# Patient Record
Sex: Male | Born: 1990 | Race: White | Hispanic: No | Marital: Married | Smoking: Never smoker
Health system: Southern US, Community
[De-identification: ages and names within clinical notes are randomized; demographics above are authoritative.]

## PROBLEM LIST (undated history)

## (undated) DIAGNOSIS — I10 Essential (primary) hypertension: Secondary | ICD-10-CM

## (undated) DIAGNOSIS — K219 Gastro-esophageal reflux disease without esophagitis: Secondary | ICD-10-CM

## (undated) HISTORY — DX: Essential (primary) hypertension: I10

## (undated) HISTORY — DX: Gastro-esophageal reflux disease without esophagitis: K21.9

---
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
## 2012-11-18 HISTORY — PX: WISDOM TOOTH EXTRACTION: SHX21

## 2017-03-26 ENCOUNTER — Ambulatory Visit (INDEPENDENT_AMBULATORY_CARE_PROVIDER_SITE_OTHER): Payer: BLUE CROSS/BLUE SHIELD | Admitting: Internal Medicine

## 2017-03-26 ENCOUNTER — Encounter: Payer: Self-pay | Admitting: Internal Medicine

## 2017-03-26 VITALS — BP 142/90 | HR 84 | Temp 98.6°F | Resp 16 | Ht 73.0 in | Wt 223.2 lb

## 2017-03-26 DIAGNOSIS — J0410 Acute tracheitis without obstruction: Secondary | ICD-10-CM | POA: Diagnosis not present

## 2017-03-26 DIAGNOSIS — J029 Acute pharyngitis, unspecified: Secondary | ICD-10-CM

## 2017-03-26 MED ORDER — PREDNISONE 20 MG PO TABS
ORAL_TABLET | ORAL | 0 refills | Status: DC
Start: 2017-03-26 — End: 2017-05-19

## 2017-03-26 MED ORDER — AZITHROMYCIN 250 MG PO TABS: ORAL_TABLET | ORAL | 1 refills | Status: DC

## 2017-03-26 NOTE — Patient Instructions (Signed)
Pharyngitis Pharyngitis is a sore throat (pharynx). There is redness, pain, and swelling of your throat. Follow these instructions at home:  Drink enough fluids to keep your pee (urine) clear or pale yellow.  Only take medicine as told by your doctor. ? You may get sick again if you do not take medicine as told. Finish your medicines, even if you start to feel better. ? Do not take aspirin.  Rest.  Rinse your mouth (gargle) with salt water ( tsp of salt per 1 qt of water) every 1-2 hours. This will help the pain.  If you are not at risk for choking, you can suck on hard candy or sore throat lozenges. Contact a doctor if:  You have large, tender lumps on your neck.  You have a rash.  You cough up green, yellow-Kirchoff, or bloody spit. Get help right away if:  You have a stiff neck.  You drool or cannot swallow liquids.  You throw up (vomit) or are not able to keep medicine or liquids down.  You have very bad pain that does not go away with medicine.  You have problems breathing (not from a stuffy nose). This information is not intended to replace advice given to you by your health care provider. Make sure you discuss any questions you have with your health care provider. Document Released: 04/22/2008 Document Revised: 04/11/2016 Document Reviewed: 07/12/2013 Elsevier Interactive Patient Education  2017 Elsevier Inc[employer]  

## 2017-03-26 NOTE — Progress Notes (Signed)
  Subjective:    Patient ID: Jaime RiegerMatthew V Little, male    DOB: 03-30-1991, 26 y.o.   MRN: [id number]  HPI    This nice 26 yo 26 yo MWM presents as a new patient w/sx's of S/T h, head/chest congestion. Has had a dry cough and sl hoarseness and denies dyspnea or significant sputum pdn.   On no routine meds & denies med allergies.  PMHx and systems review is negative.  Review of Systems     Objective:   Physical Exam  BP (!) 142/90   Pulse 84   Temp 98.6 F (37 C)   Resp 16   Ht 6\' 1"  (1.854 m)   Wt 223 lb 3.2 oz (101.2 kg)   BMI 29.45 kg/m   Dry cough. Sl hoarse. No Stridor. Skin clear w/o rash/cyanosis  HEENT - WNL except post pharynx and soft palate 2-3 (+) injected w/o exudates Neck - supple.  Chest - Clear equal BS. Cor - Nl HS. RRR w/o sig MGR. PP 1(+). No edema. MS- FROM w/o deformities.  Gait Nl. Neuro -  Nl w/o focal abnormalities.    Assessment & Plan:   1. Pharyngitis   2. Tracheitis  - predniSONE (DELTASONE) 20 MG tablet; 1 tab 3 x day for 3 days, then 1 tab 2 x day for 3 days, then 1 tab 1 x day for 5 days  Dispense: 20 tablet; Refill: 0  - azithromycin (ZITHROMAX) 250 MG tablet; Take 2 tablets (500 mg) on  Day 1,  followed by 1 tablet (250 mg) once daily on Days 2 through 5.  Dispense: 6 each; Refill: 1  -Discussed meds & SE's.

## 2017-05-19 ENCOUNTER — Ambulatory Visit (INDEPENDENT_AMBULATORY_CARE_PROVIDER_SITE_OTHER): Payer: BLUE CROSS/BLUE SHIELD | Admitting: Internal Medicine

## 2017-05-19 ENCOUNTER — Encounter: Payer: Self-pay | Admitting: Internal Medicine

## 2017-05-19 VITALS — BP 136/96 | HR 88 | Temp 97.4°F | Resp 16 | Ht 73.0 in | Wt 224.6 lb

## 2017-05-19 DIAGNOSIS — R0989 Other specified symptoms and signs involving the circulatory and respiratory systems: Secondary | ICD-10-CM

## 2017-05-19 DIAGNOSIS — I10 Essential (primary) hypertension: Secondary | ICD-10-CM | POA: Insufficient documentation

## 2017-05-19 DIAGNOSIS — J301 Allergic rhinitis due to pollen: Secondary | ICD-10-CM

## 2017-05-19 DIAGNOSIS — K219 Gastro-esophageal reflux disease without esophagitis: Secondary | ICD-10-CM | POA: Insufficient documentation

## 2017-05-19 MED ORDER — PREDNISONE 20 MG PO TABS
ORAL_TABLET | ORAL | 0 refills | Status: DC
Start: 2017-05-19 — End: 2017-06-26

## 2017-05-19 MED ORDER — RANITIDINE HCL 300 MG PO TABS: ORAL_TABLET | ORAL | 3 refills | Status: DC

## 2017-05-19 NOTE — Patient Instructions (Signed)
Heartburn Heartburn is a type of pain or discomfort that can happen in the throat or chest. It is often described as a burning pain. It may also cause a bad taste in the mouth. Heartburn may feel worse when you lie down or bend over, and it is often worse at night. Heartburn may be caused by stomach contents that move back up into the esophagus (reflux). Follow these instructions at home: Take these actions to decrease your discomfort and to help avoid complications. Diet  Follow a diet as recommended by your health care provider. This may involve avoiding foods and drinks such as: ? Coffee and tea (with or without caffeine). ? Drinks that contain alcohol. ? Energy drinks and sports drinks. ? Carbonated drinks or sodas. ? Chocolate and cocoa. ? Peppermint and mint flavorings. ? Garlic and onions. ? Horseradish. ? Spicy and acidic foods, including peppers, chili powder, curry powder, vinegar, hot sauces, and barbecue sauce. ? Citrus fruit juices and citrus fruits, such as oranges, lemons, and limes. ? Tomato-based foods, such as red sauce, chili, salsa, and pizza with red sauce. ? Fried and fatty foods, such as donuts, french fries, potato chips, and high-fat dressings. ? High-fat meats, such as hot dogs and fatty cuts of red and white meats, such as rib eye steak, sausage, ham, and bacon. ? High-fat dairy items, such as whole milk, butter, and cream cheese.  Eat small, frequent meals instead of large meals.  Avoid drinking large amounts of liquid with your meals.  Avoid eating meals during the 2-3 hours before bedtime.  Avoid lying down right after you eat.  Do not exercise right after you eat. General instructions  Pay attention to any changes in your symptoms.  Take over-the-counter and prescription medicines only as told by your health care provider. Do not take aspirin, ibuprofen, or other NSAIDs unless your health care provider told you to do so.  Do not use any tobacco  products, including cigarettes, chewing tobacco, and e-cigarettes. If you need help quitting, ask your health care provider.  Wear loose-fitting clothing. Do not wear anything tight around your waist that causes pressure on your abdomen.  Raise (elevate) the head of your bed about 6 inches (15 cm).  Try to reduce your stress, such as with yoga or meditation. If you need help reducing stress, ask your health care provider.  If you are overweight, reduce your weight to an amount that is healthy for you. Ask your health care provider for guidance about a safe weight loss goal.  Keep all follow-up visits as told by your health care provider. This is important. Contact a health care provider if:  You have new symptoms.  You have unexplained weight loss.  You have difficulty swallowing, or it hurts to swallow.  You have wheezing or a persistent cough.  Your symptoms do not improve with treatment.  You have frequent heartburn for more than two weeks. Get help right away if:  You have pain in your arms, neck, jaw, teeth, or back.  You feel sweaty, dizzy, or light-headed.  You have chest pain or shortness of breath.  You vomit and your vomit looks like blood or coffee grounds.  Your stool is bloody or black. ++++++++++++++++++++++++++++  Food Choices for Gastroesophageal Reflux Disease, Adult When you have gastroesophageal reflux disease (GERD), the foods you eat and your eating habits are very important. Choosing the right foods can help ease the discomfort of GERD. Consider working with a diet and nutrition  specialist (dietitian) to help you make healthy food choices. What general guidelines should I follow? Eating plan  Choose healthy foods low in fat, such as fruits, vegetables, whole grains, low-fat dairy products, and lean meat, fish, and poultry.  Eat frequent, small meals instead of three large meals each day. Eat your meals slowly, in a relaxed setting. Avoid bending over  or lying down until 2-3 hours after eating.  Limit high-fat foods such as fatty meats or fried foods.  Limit your intake of oils, butter, and shortening to less than 8 teaspoons each day.  Avoid the following: ? Foods that cause symptoms. These may be different for different people. Keep a food diary to keep track of foods that cause symptoms. ? Alcohol. ? Drinking large amounts of liquid with meals. ? Eating meals during the 2-3 hours before bed.  Cook foods using methods other than frying. This may include baking, grilling, or broiling. Lifestyle   Maintain a healthy weight. Ask your health care provider what weight is healthy for you. If you need to lose weight, work with your health care provider to do so safely.  Exercise for at least 30 minutes on 5 or more days each week, or as told by your health care provider.  Avoid wearing clothes that fit tightly around your waist and chest.  Do not use any products that contain nicotine or tobacco, such as cigarettes and e-cigarettes. If you need help quitting, ask your health care provider.  Sleep with the head of your bed raised. Use a wedge under the mattress or blocks under the bed frame to raise the head of the bed.   What foods are not recommended? The items listed may not be a complete list. Talk with your dietitian about what dietary choices are best for you. Grains Pastries or quick breads with added fat. [place] toast. Vegetables Deep fried vegetables. [place] fries. Any vegetables prepared with added fat. Any vegetables that cause symptoms. For some people this may include tomatoes and tomato products, chili peppers, onions and garlic, and horseradish. Fruits Any fruits prepared with added fat. Any fruits that cause symptoms. For some people this may include citrus fruits, such as oranges, grapefruit, pineapple, lemons and ESPECIALLY BANANAS. Meats and other protein foods High-fat meats, such as fatty beef or pork, hot dogs,  ribs, ham, sausage, salami and bacon. Fried meat or protein, including fried fish and fried chicken. Nuts and nut butters. Dairy Whole milk and chocolate milk. Sour cream. Cream. Ice cream. Cream cheese. Milk shakes. Beverages Coffee and tea, with or without caffeine. Carbonated beverages. Sodas. Energy drinks. Fruit juice made with acidic fruits (such as orange or grapefruit). Tomato juice. Alcoholic drinks. Fats and oils Butter. Margarine. Shortening. Sweets and desserts Chocolate and cocoa. Donuts. Seasoning and other foods Pepper. Peppermint and spearmint. Any condiments, herbs, or seasonings that cause symptoms. For some people, this may include curry, hot sauce, or vinegar-based salad dressings. Summary  When you have gastroesophageal reflux disease (GERD), food and lifestyle choices are very important to help ease the discomfort of GERD.  Eat frequent, small meals instead of three large meals each day. Eat your meals slowly, in a relaxed setting. Avoid bending over or lying down until 2-3 hours after eating.  Limit high-fat foods such as fatty meat or fried foods.

## 2017-05-19 NOTE — Progress Notes (Signed)
  Subjective:    Patient ID: Alice RiegerMatthew V Vasey, male    DOB: [birth year], 26 y.o.   MRN: [id number]  HPI     This nice 26 yo MWM presents wit a 2+ week hx/o increasing heartburn and water brash reflux  with dietary triggers and not controlled with rescue Tums. He also reports recent Allergy sx's with sneezing, itchy watery eyes/nose and post nasal drainage.   On no meds.  Review of Systems  10 point systems review negative except as above.    Objective:   Physical Exam   BP (!) 136/96   Pulse 88   Temp 97.4 F (36.3 C)   Resp 16   Ht 6\' 1"  (1.854 m)   Wt 224 lb 9.6 oz (101.9 kg)   BMI 29.63 kg/m   HEENT - WNL. Neck - supple.  Chest - Clear equal BS. Cor - Nl HS. RRR w/o sig murmur. Abd - Soft w/o masses, but has sl EG tenderness. BS Nl.  MS- FROM w/o deformities.  Gait Nl.    Assessment & Plan:   1. Gastroesophageal reflux disease, esophagitis presence not specified  - ranitidine (ZANTAC) 300 MG tablet; Take 1 tablet daily for heartburn and reflux  Dispense: 90 tablet; Refill: 3  2. Seasonal allergic rhinitis due to pollen  - predniSONE (DELTASONE) 20 MG tablet; 1 tab 3 x day for 3 days, then 1 tab 2 x day for 3 days, then 1 tab 1 x day for 5 days  Dispense: 20 tablet; Refill: 0  3. Labile hypertension

## 2017-06-26 ENCOUNTER — Encounter: Payer: Self-pay | Admitting: Internal Medicine

## 2017-06-26 ENCOUNTER — Ambulatory Visit (INDEPENDENT_AMBULATORY_CARE_PROVIDER_SITE_OTHER): Payer: BLUE CROSS/BLUE SHIELD | Admitting: Internal Medicine

## 2017-06-26 VITALS — BP 132/84 | HR 64 | Temp 97.6°F | Resp 16 | Ht 73.0 in | Wt 228.6 lb

## 2017-06-26 DIAGNOSIS — K219 Gastro-esophageal reflux disease without esophagitis: Secondary | ICD-10-CM | POA: Diagnosis not present

## 2017-06-26 MED ORDER — OMEPRAZOLE 40 MG PO CPDR: DELAYED_RELEASE_CAPSULE | ORAL | 3 refills | Status: DC

## 2017-06-26 NOTE — Progress Notes (Signed)
  Subjective:    Patient ID: Jaime RiegerMatthew V Diefenderfer, male    DOB: 01/18/1991, 26 y.o.   MRN: [id number]  HPI   This nice 26 yo MWM returns 1 mo f/u of GERD reporting taking his Ranitidine at night with good control thru-out the night and next day until about 4 pm and develops lump in throat and water brash.   Medication Sig  . OTC Xyzal 1 tablet daily.   . ranitidine (ZANTAC) 300 MG tablet Take 1 tablet daily for heartburn and reflux   Review of Systems  10 point systems review negative except as above.    Objective:   Physical Exam   BP 132/84   Pulse 64   Temp 97.6 F (36.4 C)   Resp 16   Ht 6\' 1"  (1.854 m)   Wt 228 lb 9.6 oz (103.7 kg)   BMI 30.16 kg/m   HEENT - WNL. Neck - supple.  Chest - Clear equal BS. Cor - Nl HS. RRR w/o sig MGR. PP 1(+). No edema. MS- FROM w/o deformities.  Gait Nl. Neuro -  Nl w/o focal abnormalities.    Assessment & Plan:   1. Gastroesophageal reflux disease, esophagitis presence not specified  - omeprazole (PRILOSEC) 40 MG capsule; Take 1 capsule every morning for Acid Reflux  Dispense: 90 capsule; Refill: 3  - and instructed to continue the Ranitidine at night and later try to wean off  - discussed continue anti-dyspeptic diet

## 2017-08-21 ENCOUNTER — Ambulatory Visit (INDEPENDENT_AMBULATORY_CARE_PROVIDER_SITE_OTHER): Payer: BLUE CROSS/BLUE SHIELD | Admitting: Adult Health

## 2017-08-21 ENCOUNTER — Encounter: Payer: Self-pay | Admitting: Internal Medicine

## 2017-08-21 ENCOUNTER — Encounter: Payer: Self-pay | Admitting: Adult Health

## 2017-08-21 VITALS — BP 122/80 | HR 80 | Temp 97.3°F | Resp 20 | Ht 73.0 in | Wt 225.0 lb

## 2017-08-21 DIAGNOSIS — G44311 Acute post-traumatic headache, intractable: Secondary | ICD-10-CM

## 2017-08-21 DIAGNOSIS — R42 Dizziness and giddiness: Secondary | ICD-10-CM

## 2017-08-21 LAB — BASIC METABOLIC PANEL WITH GFR
BUN: 10 mg/dL (ref 7–25)
CO2: 26 mmol/L (ref 20–32)
Calcium: 9.7 mg/dL (ref 8.6–10.3)
Chloride: 104 mmol/L (ref 98–110)
Creat: 0.89 mg/dL (ref 0.60–1.35)
GFR, Est African American: 139 mL/min/{1.73_m2} (ref >=60)
GFR, Est Non African American: 120 mL/min/{1.73_m2} (ref >=60)
Glucose, Bld: 99 mg/dL (ref 65–99)
Potassium: 4.1 mmol/L (ref 3.5–5.3)
Sodium: 140 mmol/L (ref 135–146)

## 2017-08-21 LAB — CBC WITH DIFFERENTIAL/PLATELET
BASOS PCT: 1.2 %
Basophils Absolute: 82 cells/uL (ref 0–200)
EOS PCT: 1 %
Eosinophils Absolute: 68 cells/uL (ref 15–500)
HEMATOCRIT: 46.1 % (ref 38.5–50.0)
Hemoglobin: 16.2 g/dL (ref 13.2–17.1)
LYMPHS ABS: 1782 {cells}/uL (ref 850–3900)
MCH: 29 pg (ref 27.0–33.0)
MCHC: 35.1 g/dL (ref 32.0–36.0)
MCV: 82.5 fL (ref 80.0–100.0)
MPV: 10.6 fL (ref 7.5–12.5)
Monocytes Relative: 9.2 %
Neutro Abs: 4243 cells/uL (ref 1500–7800)
Neutrophils Relative %: 62.4 %
PLATELETS: 259 10*3/uL (ref 140–400)
RBC: 5.59 10*6/uL (ref 4.20–5.80)
RDW: 12.5 % (ref 11.0–15.0)
TOTAL LYMPHOCYTE: 26.2 %
WBC: 6.8 10*3/uL (ref 3.8–10.8)
WBCMIX: 626 {cells}/uL (ref 200–950)

## 2017-08-21 LAB — TSH: TSH: 0.88 mIU/L (ref 0.40–4.50)

## 2017-08-21 NOTE — Patient Instructions (Addendum)
[address] for CT today  Head Injury, Adult There are many types of head injuries. Head injuries can be as minor as a bump, or they can be more severe. More severe head injuries include:  A jarring injury to the brain (concussion).  A bruise of the brain (contusion). This means there is bleeding in the brain that can cause swelling.  A cracked skull (skull fracture).  Bleeding in the brain that collects, clots, and forms a bump (hematoma).  After a head injury, you may need to be observed for a while in the emergency department or urgent care. Sometimes admission to the hospital is needed. After a head injury has happened, most problems occur within the first 24 hours, but side effects may occur up to 7-10 days after the injury. It is important to watch your condition for any changes. What are the causes? There are many possible causes of a head injury. A serious head injury may happen to someone who is in a car accident (motor vehicle collision). Other causes of major head injuries include bicycle or motorcycle accidents, sports injuries, and falls. Risk factors This condition is more likely to occur in people who:  Drink a lot of alcohol or use drugs.  Are over the age of 8.  Are at risk for falls.  What are the symptoms? There are many possible symptoms of a head injury. Visible symptoms of a head injury include a bruise, bump, or bleeding at the site of the injury. Other non-visible symptoms include:  Feeling sleepy or not being able to stay awake.  Passing out.  Headache.  Seizures.  Dizziness.  Confusion.  Memory problems.  Nausea or vomiting.  Other possible symptoms that may develop after the head injury include:  Poor attention and concentration.  Fatigue or tiring easily.  Irritability.  Being uncomfortable around bright lights or loud noises.  Anxiety or depression.  Disturbed sleep.  How is this diagnosed? This condition can usually  be diagnosed based on your symptoms, a description of the injury, and a physical exam. You may also have imaging tests done, such as a CT scan or MRI. You will also be closely watched. How is this treated? Treatment for this condition depends on the severity and type of injury you have. The main goal of treatment is to prevent complications and allow the brain time to heal. For mild head injury, you may be sent home and treatment may include:  Observation. A responsible adult should stay with you for 24 hours after your injury and check on you often.  Physical rest.  Brain rest.  Pain medicines.  For severe brain injury, treatment may include:  Close observation. This includes hospitalization with frequent physical exams. You may need to go to a hospital that specializes in head injury.  Pain medicines.  Breathing support. This may include using a ventilator.  Managing the pressure inside the brain (intracranial pressure, or ICP). This may include: ? Monitoring the ICP. ? Giving medicines to decrease the ICP. ? Positioning you to decrease the ICP.  Medicine to prevent seizures.  Surgery to stop bleeding or to remove blood clots (craniotomy).  Surgery to remove part of the skull (decompressive craniectomy). This allows room for the brain to swell.  Follow these instructions at home: Activity  Rest as much as possible and avoid activities that are physically hard or tiring.  Make sure you get enough sleep.  Limit activities that require a lot of thought or  attention, such as: ? Watching TV. ? Playing memory games and puzzles. ? Job-related work or homework. ? Working on [employer], and texting.  Avoid activities that could cause another head injury, such as playing sports, until your health care provider approves. Having another head injury, especially before the first one has healed, can be dangerous.  Ask your health care provider when it is safe for you  to return to your regular activities, including work or school. Ask your health care provider for a step-by-step plan for gradually returning to activities.  Ask your health care provider when you can drive, ride a bicycle, or use heavy machinery. Your ability to react may be slower after a brain injury. Never do these activities if you are dizzy.  Lifestyle  Do not drink alcohol until your health care provider approves, and avoid drug use. Alcohol and certain drugs may slow your recovery and can put you at risk of further injury.  If it is harder than usual to remember things, write them down.  If you are easily distracted, try to do one thing at a time.  Talk with family members or close friends when making important decisions.  Tell your friends, family, a trusted colleague, and work Production designer, theatre/television/film about your injury, symptoms, and restrictions. Have them watch for any new or worsening problems.  General instructions  Take over-the-counter and prescription medicines only as told by your health care provider.  Have someone stay with you for 24 hours after your head injury. This person should watch you for any changes in your symptoms and be ready to seek medical help, as needed.  Keep all follow-up visits as told by your health care provider. This is important.  Prevention  Work on improving your balance and strength to avoid falls.  Wear a seatbelt when you are in a moving vehicle.  Wear a helmet when riding a bicycle, skiing, or doing any other sport or activity that has a risk of injury.  Drink alcohol only in moderation.  Take safety measures in your home, such as: ? Removing clutter and tripping hazards from floors and stairways. ? Using grab bars in bathrooms and handrails by stairs. ? Placing non-slip mats on floors and in bathtubs. ? Improving lighting in dim areas. Get help right away if:  You have: ? A severe headache that is not helped by medicine. ? Trouble walking,  have weakness in your arms and legs, or lose your balance. ? Clear or bloody fluid coming from your nose or ears. ? Changes in your vision. ? A seizure.  You vomit.  Your symptoms get worse.  Your speech is slurred.  You pass out.  You are sleepier and have trouble staying awake.  Your pupils change size. These symptoms may represent a serious problem that is an emergency. Do not wait to see if the symptoms will go away. Get medical help right away. Call your local emergency services (911 in the [place] Do not drive yourself to the hospital. This information is not intended to replace advice given to you by your health care provider. Make sure you discuss any questions you have with your health care provider. Document Released: 11/04/2005 Document Revised: 05/31/2016 Document Reviewed: 05/14/2016 Elsevier Interactive Patient Education  2017 [employer].

## 2017-08-21 NOTE — Progress Notes (Signed)
Assessment and Plan:   Jaime Little was seen today for acute visit post head trauma with ongoing headache for the past week.  Diagnoses and all orders for this visit:  Dizziness -     CBC with Differential/Platelet -     BASIC METABOLIC PANEL WITH GFR -     TSH  Intractable acute post-traumatic headache -     CT Head Wo Contrast; Future  Obtaining basic labs to rule out possible contributors to HA/dizziness/restless legs, consulted Dr. Oneta Rack who is in agreement to order CT to rule out complications from recent trauma. We will wait to prescribe any medications until labs/imaging have resulted. Post head trauma precaution information given, ER precautions given.   Further disposition pending results of labs. Discussed med's effects and SE's.   Over 30 minutes of exam, counseling, chart review, and critical decision making was performed.   No future appointments.  ------------------------------------------------------------------------------------------------------------------   HPI BP 122/80   Pulse 80   Temp (!) 97.3 F (36.3 C)   Resp 20   Ht  (1.854 m)   Wt 225 lb (102.1 kg)   SpO2 98%   BMI 29.69 kg/m   26 y.o.male presents post head trauma at work 1 month ago (9/6) at which time he hit his head on a metal boom. He denies noting N/V/dizziness, changes in vision, weakness at that time, but now is experiencing ongoing dizziness x1 week as well as HA (deep pressure, tingling sensation) that gets worse progressively during the day to a 3 or 4 out of 10. "Something's just not right," states he gets "jitters in his legs" when he has the headaches as well. Reports current pain as 2/10, has taken ibuprofen for this once, but is unsure if that helped.   Has cut out sugar recently, stopped chewing tobacco.  Water: 4-5 bottles daily  Denies history of headaches prior to this past month.   Current Outpatient Prescriptions on File Prior to Visit  Medication Sig  . omeprazole  (PRILOSEC) 40 MG capsule Take 1 capsule every morning for Acid Reflux  . OVER THE COUNTER MEDICATION 1 tablet daily. Patient takes OTC Xyzal   No current facility-administered medications on file prior to visit.     ROS: Review of Systems  Constitutional: Negative for chills, diaphoresis, fever, malaise/fatigue and weight loss.  HENT: Negative for ear discharge, ear pain, hearing loss and nosebleeds.   Eyes: Negative for blurred vision, double vision, photophobia and pain.  Respiratory: Negative for cough and shortness of breath.   Cardiovascular: Negative for chest pain and palpitations.  Gastrointestinal: Negative for nausea and vomiting.  Musculoskeletal: Negative for falls and neck pain.  Skin: Negative for rash.  Neurological: Positive for dizziness, tingling and headaches. Negative for tremors, sensory change, speech change, focal weakness, seizures, loss of consciousness and weakness.  Endo/Heme/Allergies: Does not bruise/bleed easily.  Psychiatric/Behavioral: Negative for depression, memory loss and substance abuse. The patient is not nervous/anxious and does not have insomnia.   All other systems reviewed and are negative.    Physical Exam:  BP 122/80   Pulse 80   Temp (!) 97.3 F (36.3 C)   Resp 20   Ht  (1.854 m)   Wt 225 lb (102.1 kg)   SpO2 98%   BMI 29.69 kg/m   General Appearance: Well nourished, in no acute distress, calm, appears concerned. Eyes: PERRLA, EOMs, conjunctiva no swelling or erythema Sinuses: No Frontal/maxillary tenderness ENT/Mouth: Ext aud canals clear, TMs without erythema, bulging.  No erythema, swelling, or exudate on post pharynx.  Tonsils not swollen or erythematous. Hearing normal.  Neck: Supple, thyroid normal.  Respiratory: Respiratory effort normal, BS equal bilaterally without rales, rhonchi, wheezing or stridor.  Cardio: RRR with no MRGs. Brisk peripheral pulses without edema.  Abdomen: Soft, + BS.  Non tender, no guarding,  rebound, hernias, masses. Lymphatics: Non tender without lymphadenopathy.  Musculoskeletal: Full ROM, 5/5 strength, normal gait. Reflexes present but difficult to ellicit, symmetrical- patellar, achilles, brachioradialis, biceps, tricepts.  Skin: Warm, dry without rashes, lesions, ecchymosis.  Neuro: Cranial nerves intact. Normal muscle tone, no cerebellar symptoms. Sensation intact. No nystagmus noted.  Psych: Awake and oriented X 3, normal affect, Insight and Judgment appropriate.     Dan Maker, NP 12:11 PM [facility]

## 2017-08-22 ENCOUNTER — Ambulatory Visit
Admission: RE | Admit: 2017-08-22 | Discharge: 2017-08-22 | Disposition: A | Discharge status: Home / Self Care | Payer: BLUE CROSS/BLUE SHIELD | Source: Ambulatory Visit | Attending: Adult Health | Admitting: Adult Health

## 2017-08-22 DIAGNOSIS — G44311 Acute post-traumatic headache, intractable: Secondary | ICD-10-CM

## 2017-08-22 NOTE — Progress Notes (Signed)
Pt aware of lab results & voiced understanding of those results.

## 2017-08-25 NOTE — Progress Notes (Signed)
Pt aware of lab results & voiced understanding of those results. Pt states his headaches have improved & he is drinking fluids & will use Tylenol/ibuprofen if he needs to.

## 2017-09-15 ENCOUNTER — Ambulatory Visit (INDEPENDENT_AMBULATORY_CARE_PROVIDER_SITE_OTHER): Payer: BLUE CROSS/BLUE SHIELD | Admitting: Physician Assistant

## 2017-09-15 ENCOUNTER — Encounter: Payer: Self-pay | Admitting: Physician Assistant

## 2017-09-15 VITALS — BP 136/80 | HR 101 | Temp 97.5°F | Resp 16 | Ht 73.0 in | Wt 231.4 lb

## 2017-09-15 DIAGNOSIS — J029 Acute pharyngitis, unspecified: Secondary | ICD-10-CM | POA: Diagnosis not present

## 2017-09-15 DIAGNOSIS — J358 Other chronic diseases of tonsils and adenoids: Secondary | ICD-10-CM

## 2017-09-15 LAB — POCT RAPID STREP A (OFFICE): Rapid Strep A Screen: NEGATIVE

## 2017-09-15 NOTE — Progress Notes (Signed)
   Subjective:    Patient ID: Jaime Little, male    DOB: 14-Sep-1991, 26 y.o.   MRN: [id number]  HPI 26 y.o. WM presents with ST x Thursday, some white spots on left throat, felt hot. Family with strep, negative strep here in the office. He states he is feeling better other than runny nose. He has been taking dayquil/nyquil x Friday only  Blood pressure 136/80, pulse (!) 101, temperature (!) 97.5 F (36.4 C), resp. rate 16, height 6\' 1"  (1.854 m), weight 231 lb 6.4 oz (105 kg), SpO2 97 %.  Medications Current Outpatient Prescriptions on File Prior to Visit  Medication Sig  . omeprazole (PRILOSEC) 40 MG capsule Take 1 capsule every morning for Acid Reflux  . OVER THE COUNTER MEDICATION 1 tablet daily. Patient takes OTC Xyzal   No current facility-administered medications on file prior to visit.     Problem list He has Labile hypertension and GERD (gastroesophageal reflux disease) on his problem list.    Review of Systems  Constitutional: Negative for chills, diaphoresis and fever.  HENT: Positive for postnasal drip, rhinorrhea and sore throat. Negative for congestion, ear pain, sinus pressure, sneezing, trouble swallowing and voice change.   Eyes: Negative.   Respiratory: Negative for cough, chest tightness, shortness of breath and wheezing.   Cardiovascular: Negative.   Gastrointestinal: Negative.   Genitourinary: Negative.   Musculoskeletal: Negative for neck pain.  Neurological: Negative.  Negative for headaches.       Objective:   Physical Exam  Constitutional: He is oriented to person, place, and time. He appears well-developed and well-nourished.  HENT:  Head: Normocephalic and atraumatic.  Right Ear: Hearing and tympanic membrane normal.  Left Ear: Hearing and tympanic membrane normal.  Nose: Right sinus exhibits no maxillary sinus tenderness. Left sinus exhibits no maxillary sinus tenderness.  Mouth/Throat: Uvula is midline and mucous membranes are normal.  Posterior oropharyngeal erythema present. No oropharyngeal exudate, posterior oropharyngeal edema or tonsillar abscesses.  With tonsil stone partially removed from left tonsil, no tenderness, no swelling.   Eyes: Pupils are equal, round, and reactive to light. Conjunctivae are normal.  Neck: Normal range of motion. Neck supple.  Cardiovascular: Normal rate and regular rhythm.   Pulmonary/Chest: Effort normal and breath sounds normal.  Abdominal: Soft. Bowel sounds are normal.  Musculoskeletal: Normal range of motion.  Lymphadenopathy:    He has no cervical adenopathy.  Neurological: He is alert and oriented to person, place, and time.  Skin: Skin is warm and dry. No rash noted.        Assessment & Plan:    Sore throat/tonsil stone -     POCT rapid strep A - get on allergy pill - salt gargles - flonase - if not better call office for ABX    No future appointments.

## 2017-09-15 NOTE — Patient Instructions (Signed)
Your ears and sinuses are connected by the eustachian tube. When your sinuses are inflamed, this can close off the tube and cause fluid to collect in your middle ear. This can then cause dizziness, popping, clicking, ringing, and echoing in your ears. This is often NOT an infection and does NOT require antibiotics, it is caused by inflammation so the treatments help the inflammation. This can take a long time to get better so please be patient.  Here are things you can do to help with this:  - Try the Flonase or Nasonex. Remember to spray each nostril twice towards the outer part of your eye.  Do not sniff but instead pinch your nose and tilt your head back to help the medicine get into your sinuses.  The best time to do this is at bedtime.Stop if you get blurred vision or nose bleeds.   -please get on daily allergy pill The strongest is allegra or fexafinadine  Cheapest at [employer], [employer], [employer]  if worsening HA, changes vision/speech, imbalance, weakness go to the ER  What Causes Tonsil Stones? Your tonsils are filled with nooks and crannies where bacteria and other materials, including dead cells and mucous, can become trapped. When this happens, the debris can become concentrated in white formations that occur in the pockets. Tonsil stones, or tonsilloliths, are formed when this trapped debris hardens, or calcifies. This tends to happen most often in people who have chronic inflammation in their tonsils or repeated bouts of tonsillitis. While many people have small tonsilloliths that develop in their tonsils, it is quite rare to have a large and solidified tonsil stone.  How Are Tonsil Stones Treated? The appropriate treatment for a tonsil stone depends on the size of the tonsillolith and its potential to cause discomfort or harm. Options include: No treatment. Many tonsil stones, especially ones that have no symptoms, require no special treatment. At-home removal. Some people choose to  dislodge tonsil stones at home with the use of picks or swabs. Salt water gargles. Gargling with warm, salty water may help ease the discomfort of tonsillitis, which often accompanies tonsil stones. Allergy pills- Taking allergy pill every day can decrease the inflammation and decrease the formation of tonsil stones Continue reading below...  Antibiotics. Various antibiotics can be used to treat tonsil stones. While they may be helpful for some people, they cannot correct the basic problem that is causing tonsilloliths. Also, antibiotics can have side effects. Surgical removal. When tonsil stones are exceedingly large and symptomatic, it may be necessary for a surgeon to remove them. In certain instances, a doctor will be able to perform this relatively simple procedure using a local numbing agent. Then the patient will not need general anesthesia.  Can Tonsil Stones Be Prevented? Take an antihistamine daily can prevent inflammation and formation.  Since tonsil stones are more common in people who have chronic tonsillitis, the only surefire way to prevent them is with surgical removal of the tonsils. This procedure, known as a tonsillectomy, removes the tissues of the tonsils entirely, thereby eliminating the possibility of tonsillolith formation, however, adults are more likely to have complications from the surgery than children so it is considered a last resort.

## 2017-10-05 NOTE — Progress Notes (Signed)
Assessment and Plan: Jaime Little was seen today for postnasal drip.  Diagnoses and all orders for this visit:  Non-seasonal allergic rhinitis due to other allergic trigger Continue daily allergy pill Advised wearing a mask to prevent exposure to trigger/irritant (wood dust) -     azelastine (ASTELIN) 0.1 % nasal spray; Place 2 sprays 2 (two) times daily into both nostrils. Use in each nostril as directed  Further disposition pending results of labs. Discussed med's effects and SE's.   Over 15 minutes of exam, counseling, chart review, and critical decision making was performed.   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 He reports he is spitting up clear/[place] mucus. The patient has been seen on several occasions over the past several months for repeated throat/GERD issues.  His medical history includes GERD for which he takes prilosec 40 mg daily - previously on ranitidine 300 mg. He reports his GERD symptoms are well controlled and denies breakthrough reflux, burning in chest, hoarseness or cough.  Denies SOB/CP/wheezing.   He reports the symptoms are worst in the late afternoon around 4-5; symptoms improve with chewing gum. He has been taking a daily claritin/zyrtec - currently taking allegra daily. He was taking flonase daily for a while but felt it was making symptoms worse.   He reports he has quit chewing tobacco  History reviewed. No pertinent past  medical history.   Not on File  Current Outpatient Medications on File Prior to Visit  Medication Sig  . aspirin 81 MG chewable tablet Chew 81 mg daily by mouth.  . Cholecalciferol 5000 units TABS Take 10,000 Units daily by mouth.  . fexofenadine (ALLEGRA) 30 MG tablet Take 30 mg 2 (two) times daily by mouth.  Marland Kitchen. omeprazole (PRILOSEC) 40 MG capsule Take 1 capsule every morning for Acid Reflux  . OVER THE COUNTER MEDICATION 1 tablet daily. Patient takes OTC Xyzal   No current facility-administered medications on file prior to visit.     ROS: Review of Systems  Constitutional: Negative for chills, fever, malaise/fatigue and weight loss.  HENT: Negative for congestion, hearing loss, sinus pain, sore throat and tinnitus.   Eyes: Negative for blurred vision and double vision.  Respiratory: Negative for cough, sputum production, shortness of breath and wheezing.   Cardiovascular: Negative for chest pain, palpitations, orthopnea, claudication and leg swelling.  Gastrointestinal: Negative for abdominal pain, blood in stool, constipation, diarrhea, heartburn, melena, nausea and vomiting.  Genitourinary: Negative.   Musculoskeletal: Negative for joint pain and myalgias.  Skin: Negative for itching and rash.  Neurological: Negative for dizziness, tingling, sensory change, weakness and headaches.  Endo/Heme/Allergies: Negative for environmental allergies and polydipsia.  Psychiatric/Behavioral: Negative.   All other systems reviewed and are negative.   Physical Exam:  BP 132/90   Pulse 86   Temp 97.7 F (36.5 C)   Ht 6\' 1"  (1.854 m)   Wt 233 lb 9.6 oz (106 kg)   SpO2 95%  BMI 30.82 kg/m   General Appearance: Well nourished, in no apparent distress. Eyes: PERRLA, EOMs, conjunctiva no swelling or erythema Sinuses: No Frontal/maxillary tenderness ENT/Mouth: Ext aud canals clear, TMs without erythema, bulging. Nasal passages and turbinates mildly injected. No erythema, swelling, or  exudate on post pharynx. Cobblestoning noted. Tonsils not swollen or erythematous. Hearing normal.  Neck: Supple, thyroid normal.  Respiratory: Respiratory effort normal, BS equal bilaterally without rales, rhonchi, wheezing or stridor.  Cardio: RRR with no MRGs. Brisk peripheral pulses without edema.  Abdomen: Soft, + BS.  Non tender, no guarding, rebound, hernias, masses. Lymphatics: Non tender without lymphadenopathy.  Musculoskeletal: Full ROM, 5/5 strength, normal gait.  Skin: Warm, dry without rashes, lesions, ecchymosis.  Neuro: Cranial nerves intact. Normal muscle tone, no cerebellar symptoms. Sensation intact.  Psych: Awake and oriented X 3, normal affect, Insight and Judgment appropriate.     Dan MakerAshley C Chania Kochanski, NP 11:46 AM [facility]

## 2017-10-06 ENCOUNTER — Encounter: Payer: Self-pay | Admitting: Adult Health

## 2017-10-06 ENCOUNTER — Ambulatory Visit: Payer: BLUE CROSS/BLUE SHIELD | Admitting: Adult Health

## 2017-10-06 VITALS — BP 132/90 | HR 86 | Temp 97.7°F | Ht 73.0 in | Wt 233.6 lb

## 2017-10-06 DIAGNOSIS — J3089 Other allergic rhinitis: Secondary | ICD-10-CM | POA: Diagnosis not present

## 2017-10-06 DIAGNOSIS — J309 Allergic rhinitis, unspecified: Secondary | ICD-10-CM | POA: Insufficient documentation

## 2017-10-06 MED ORDER — AZELASTINE HCL 0.1 % NA SOLN: 2.0000 | Freq: Two times a day (BID) | NASAL | 2 refills | Status: DC

## 2017-10-06 NOTE — Patient Instructions (Signed)
Allergic Rhinitis Allergic rhinitis is when the mucous membranes in the nose respond to allergens. Allergens are particles in the air that cause your body to have an allergic reaction. This causes you to release allergic antibodies. Through a chain of events, these eventually cause you to release histamine into the blood stream. Although meant to protect the body, it is this release of histamine that causes your discomfort, such as frequent sneezing, congestion, and an itchy, runny nose. What are the causes? Seasonal allergic rhinitis (hay fever) is caused by pollen allergens that may come from grasses, trees, and weeds. Year-round allergic rhinitis (perennial allergic rhinitis) is caused by allergens such as house dust mites, pet dander, and mold spores. What are the signs or symptoms?  Nasal stuffiness (congestion).  Itchy, runny nose with sneezing and tearing of the eyes. How is this diagnosed? Your health care provider can help you determine the allergen or allergens that trigger your symptoms. If you and your health care provider are unable to determine the allergen, skin or blood testing may be used. Your health care provider will diagnose your condition after taking your health history and performing a physical exam. Your health care provider may assess you for other related conditions, such as asthma, pink eye, or an ear infection. How is this treated? Allergic rhinitis does not have a cure, but it can be controlled by:  Medicines that block allergy symptoms. These may include allergy shots, nasal sprays, and oral antihistamines.  Avoiding the allergen. Hay fever may often be treated with antihistamines in pill or nasal spray forms. Antihistamines block the effects of histamine. There are over-the-counter medicines that may help with nasal congestion and swelling around the eyes. Check with your health care provider before taking or giving this medicine. If avoiding the allergen or the  medicine prescribed do not work, there are many new medicines your health care provider can prescribe. Stronger medicine may be used if initial measures are ineffective. Desensitizing injections can be used if medicine and avoidance does not work. Desensitization is when a patient is given ongoing shots until the body becomes less sensitive to the allergen. Make sure you follow up with your health care provider if problems continue. Follow these instructions at home: It is not possible to completely avoid allergens, but you can reduce your symptoms by taking steps to limit your exposure to them. It helps to know exactly what you are allergic to so that you can avoid your specific triggers. Contact a health care provider if:  You have a fever.  You develop a cough that does not stop easily (persistent).  You have shortness of breath.  You start wheezing.  Symptoms interfere with normal daily activities. This information is not intended to replace advice given to you by your health care provider. Make sure you discuss any questions you have with your health care provider. Document Released: 07/30/2001 Document Revised: 07/05/2016 Document Reviewed: 07/12/2013 [employer] Interactive Patient Education  2017 [employer]  

## 2018-03-18 ENCOUNTER — Ambulatory Visit: Payer: BLUE CROSS/BLUE SHIELD | Admitting: Adult Health

## 2018-03-18 ENCOUNTER — Encounter: Payer: Self-pay | Admitting: Adult Health

## 2018-03-18 VITALS — BP 142/100 | HR 80 | Temp 97.5°F | Ht 73.0 in | Wt 244.0 lb

## 2018-03-18 DIAGNOSIS — Z79899 Other long term (current) drug therapy: Secondary | ICD-10-CM | POA: Diagnosis not present

## 2018-03-18 DIAGNOSIS — I10 Essential (primary) hypertension: Secondary | ICD-10-CM

## 2018-03-18 DIAGNOSIS — Z8241 Family history of sudden cardiac death: Secondary | ICD-10-CM | POA: Insufficient documentation

## 2018-03-18 DIAGNOSIS — Z0000 Encounter for general adult medical examination without abnormal findings: Secondary | ICD-10-CM | POA: Diagnosis not present

## 2018-03-18 DIAGNOSIS — E559 Vitamin D deficiency, unspecified: Secondary | ICD-10-CM | POA: Diagnosis not present

## 2018-03-18 DIAGNOSIS — Z136 Encounter for screening for cardiovascular disorders: Secondary | ICD-10-CM | POA: Diagnosis not present

## 2018-03-18 DIAGNOSIS — Z1329 Encounter for screening for other suspected endocrine disorder: Secondary | ICD-10-CM | POA: Diagnosis not present

## 2018-03-18 DIAGNOSIS — Z1389 Encounter for screening for other disorder: Secondary | ICD-10-CM | POA: Diagnosis not present

## 2018-03-18 DIAGNOSIS — Z131 Encounter for screening for diabetes mellitus: Secondary | ICD-10-CM | POA: Diagnosis not present

## 2018-03-18 DIAGNOSIS — Z13220 Encounter for screening for lipoid disorders: Secondary | ICD-10-CM

## 2018-03-18 DIAGNOSIS — Z0001 Encounter for general adult medical examination with abnormal findings: Secondary | ICD-10-CM

## 2018-03-18 DIAGNOSIS — K219 Gastro-esophageal reflux disease without esophagitis: Secondary | ICD-10-CM

## 2018-03-18 LAB — CBC WITH DIFFERENTIAL/PLATELET
BASOS PCT: 1 %
Basophils Absolute: 71 cells/uL (ref 0–200)
Eosinophils Absolute: 50 cells/uL (ref 15–500)
Eosinophils Relative: 0.7 %
HEMATOCRIT: 47.5 % (ref 38.5–50.0)
Hemoglobin: 16.6 g/dL (ref 13.2–17.1)
LYMPHS ABS: 1470 {cells}/uL (ref 850–3900)
MCH: 28.8 pg (ref 27.0–33.0)
MCHC: 34.9 g/dL (ref 32.0–36.0)
MCV: 82.5 fL (ref 80.0–100.0)
MPV: 10.9 fL (ref 7.5–12.5)
Monocytes Relative: 8 %
NEUTROS ABS: 4942 {cells}/uL (ref 1500–7800)
Neutrophils Relative %: 69.6 %
Platelets: 259 10*3/uL (ref 140–400)
RBC: 5.76 10*6/uL (ref 4.20–5.80)
RDW: 12.9 % (ref 11.0–15.0)
Total Lymphocyte: 20.7 %
WBC: 7.1 10*3/uL (ref 3.8–10.8)
WBCMIX: 568 {cells}/uL (ref 200–950)

## 2018-03-18 LAB — TSH: TSH: 1.38 mIU/L (ref 0.40–4.50)

## 2018-03-18 MED ORDER — OLMESARTAN MEDOXOMIL 40 MG PO TABS: ORAL_TABLET | ORAL | 1 refills | Status: DC

## 2018-03-18 NOTE — Patient Instructions (Addendum)
Work on [employer] below 225 lb (aim to get lower during the summer)  Recommend cutting down on caffeine intake for your heart and blood pressure  ----------------------------------------------   Aim for 7+ servings of fruits and vegetables daily  80+ fluid ounces of water or unsweet tea for healthy kidneys  Limit alcohol intake  Limit caffeine intake   Limit animal fats in diet for cholesterol and heart health - choose grass fed whenever available  Aim for low stress - take time to unwind and care for your mental health  Aim for 150 min of moderate intensity exercise weekly for heart health, and weights twice weekly for bone health  Aim for 7-9 hours of sleep daily   Monitor your blood pressure at home, please keep a record and bring that in with you to your next office visit.   Go to the ER if any CP, SOB, nausea, dizziness, severe HA, changes vision/speech  Due to a recent study, SPRINT, we have changed our goal for the systolic or top blood pressure number. Ideally we want your top number at 120.  In the [facility] Trial, 5000 people were randomized to a goal BP of 120 and 5000 people were randomized to a goal BP of less than 140. The patients with the goal BP at 120 had LESS DEMENTIA, LESS HEART ATTACKS, AND LESS STROKES, AS WELL AS OVERALL DECREASED MORTALITY OR DEATH RATE.   If you are willing, our goal BP is 120/70 Your most recent BP: BP: (!) 142/100   Take your medications faithfully as instructed. Maintain a healthy weight. Get at least 150 minutes of aerobic exercise per week. Minimize salt intake. Minimize alcohol intake  DASH Eating Plan DASH stands for "Dietary Approaches to Stop Hypertension." The DASH eating plan is a healthy eating plan that has been shown to reduce high blood pressure (hypertension). Additional health benefits may include reducing the risk of type 2 diabetes mellitus, heart disease, and stroke. The DASH eating plan may also  help with weight loss. WHAT DO I NEED TO KNOW ABOUT THE DASH EATING PLAN? For the DASH eating plan, you will follow these general guidelines:  Choose foods with a percent daily value for sodium of less than 5% (as listed on the food label).  Use salt-free seasonings or herbs instead of table salt or sea salt.  Check with your health care provider or pharmacist before using salt substitutes.  Eat lower-sodium products, often labeled as "lower sodium" or "no salt added."  Eat fresh foods.  Eat more vegetables, fruits, and low-fat dairy products.  Choose whole grains. Look for the word "whole" as the first word in the ingredient list.  Choose fish and skinless chicken or [place] more often than red meat. Limit fish, poultry, and meat to 6 oz (170 g) each day.  Limit sweets, desserts, sugars, and sugary drinks.  Choose heart-healthy fats.  Limit cheese to 1 oz (28 g) per day.  Eat more home-cooked food and less restaurant, buffet, and fast food.  Limit fried foods.  Cook foods using methods other than frying.  Limit canned vegetables. If you do use them, rinse them well to decrease the sodium.  When eating at a restaurant, ask that your food be prepared with less salt, or no salt if possible. WHAT FOODS CAN I EAT? Seek help from a dietitian for individual calorie needs. Grains Whole grain or whole wheat bread. Captain rice. Whole grain or whole wheat pasta. Quinoa, bulgur, and whole  grain cereals. Low-sodium cereals. Corn or whole wheat flour tortillas. Whole grain cornbread. Whole grain crackers. Low-sodium crackers. Vegetables Fresh or frozen vegetables (raw, steamed, roasted, or grilled). Low-sodium or reduced-sodium tomato and vegetable juices. Low-sodium or reduced-sodium tomato sauce and paste. Low-sodium or reduced-sodium canned vegetables.  Fruits All fresh, canned (in natural juice), or frozen fruits. Meat and Other Protein Products Ground beef (85% or leaner), grass-fed  beef, or beef trimmed of fat. Skinless chicken or [place]. Ground chicken or [place]. Pork trimmed of fat. All fish and seafood. Eggs. Dried beans, peas, or lentils. Unsalted nuts and seeds. Unsalted canned beans. Dairy Low-fat dairy products, such as skim or 1% milk, 2% or reduced-fat cheeses, low-fat ricotta or cottage cheese, or plain low-fat yogurt. Low-sodium or reduced-sodium cheeses. Fats and Oils Tub margarines without trans fats. Light or reduced-fat mayonnaise and salad dressings (reduced sodium). Avocado. Safflower, olive, or canola oils. Natural peanut or almond butter. Other Unsalted popcorn and pretzels. The items listed above may not be a complete list of recommended foods or beverages. Contact your dietitian for more options. WHAT FOODS ARE NOT RECOMMENDED? Grains White bread. White pasta. White rice. Refined cornbread. Bagels and croissants. Crackers that contain trans fat. Vegetables Creamed or fried vegetables. Vegetables in a cheese sauce. Regular canned vegetables. Regular canned tomato sauce and paste. Regular tomato and vegetable juices. Fruits Dried fruits. Canned fruit in light or heavy syrup. Fruit juice. Meat and Other Protein Products Fatty cuts of meat. Ribs, chicken wings, bacon, sausage, bologna, salami, chitterlings, fatback, hot dogs, bratwurst, and packaged luncheon meats. Salted nuts and seeds. Canned beans with salt. Dairy Whole or 2% milk, cream, half-and-half, and cream cheese. Whole-fat or sweetened yogurt. Full-fat cheeses or blue cheese. Nondairy creamers and whipped toppings. Processed cheese, cheese spreads, or cheese curds. Condiments Onion and garlic salt, seasoned salt, table salt, and sea salt. Canned and packaged gravies. Worcestershire sauce. Tartar sauce. Barbecue sauce. Teriyaki sauce. Soy sauce, including reduced sodium. Steak sauce. Fish sauce. Oyster sauce. Cocktail sauce. Horseradish. Ketchup and mustard. Meat flavorings and tenderizers.  Bouillon cubes. Hot sauce. Tabasco sauce. Marinades. Taco seasonings. Relishes. Fats and Oils Butter, stick margarine, lard, shortening, ghee, and bacon fat. Coconut, palm kernel, or palm oils. Regular salad dressings. Other Pickles and olives. Salted popcorn and pretzels. The items listed above may not be a complete list of foods and beverages to avoid. Contact your dietitian for more information. WHERE CAN I FIND MORE INFORMATION? National Heart, Lung, and Blood Institute: [employer] Document Released: 10/24/2011 Document Revised: 03/21/2014 Document Reviewed: 09/08/2013 [facility] Patient Information 2015 [place], [place]. This information is not intended to replace advice given to you by your health care provider. Make sure you discuss any questions you have with your health care provider.   Olmesartan tablets What is this medicine? OLMESARTAN (all mi SAR tan) is used to treat high blood pressure. This medicine may be used for other purposes; ask your health care provider or pharmacist if you have questions. COMMON BRAND NAME(S): Benicar What should I tell my health care provider before I take this medicine? They need to know if you have any of these conditions: -if you are on a special diet, such as a low-salt diet -kidney or liver disease -an unusual or allergic reaction to olmesartan, other medicines, foods, dyes, or preservatives -pregnant or trying to get pregnant -breast-feeding How should I use this medicine? Take this medicine by mouth with a glass of water. Follow the directions on the prescription label. This medicine  can be taken with or without food. Take your doses at regular intervals. Do not take your medicine more often than directed. Do not stop taking except on the advice of your doctor or health care professional. Talk to your pediatrician regarding the use of this medicine in children. While this drug may be prescribed for  children as young as 6 years for selected conditions, precautions do apply. Overdosage: If you think you have taken too much of this medicine contact a poison control center or emergency room at once. NOTE: This medicine is only for you. Do not share this medicine with others. What if I miss a dose? If you miss a dose, take it as soon as you can. If it is almost time for your next dose, take only that dose. Do not take double or extra doses. What may interact with this medicine? -blood pressure medicines -diuretics, especially triamterene, spironolactone or amiloride -potassium salts or potassium supplements This list may not describe all possible interactions. Give your health care provider a list of all the medicines, herbs, non-prescription drugs, or dietary supplements you use. Also tell them if you smoke, drink alcohol, or use illegal drugs. Some items may interact with your medicine. What should I watch for while using this medicine? Visit your doctor or health care professional for regular checks on your progress. Check your blood pressure as directed. Ask your doctor or health care professional what your blood pressure should be and when you should contact him or her. Call your doctor or health care professional if you notice an irregular or fast heart beat. Women should inform their doctor if they wish to become pregnant or think they might be pregnant. There is a potential for serious side effects to an unborn child, particularly in the second or third trimester. Talk to your health care professional or pharmacist for more information. You may get drowsy or dizzy. Do not drive, use machinery, or do anything that needs mental alertness until you know how this drug affects you. Do not stand or sit up quickly, especially if you are an older patient. This reduces the risk of dizzy or fainting spells. Alcohol can make you more drowsy and dizzy. Avoid alcoholic drinks. Avoid salt substitutes unless  you are told otherwise by your doctor or health care professional. Do not treat yourself for coughs, colds, or pain while you are taking this medicine without asking your doctor or health care professional for advice. Some ingredients may increase your blood pressure. What side effects may I notice from receiving this medicine? Side effects that you should report to your doctor or health care professional as soon as possible: -confusion, dizziness, light headedness or fainting spells -decreased amount of urine passed -diarrhea -difficulty breathing or swallowing, hoarseness, or tightening of the throat -fast or irregular heart beat, palpitations, or chest pain -skin rash, itching -swelling of your face, lips, tongue, hands, or feet -vomiting -weight loss Side effects that usually do not require medical attention (report to your doctor or health care professional if they continue or are bothersome): -cough -decreased sexual function or desire -headache -nasal congestion or stuffiness -nausea -sore or cramping muscles This list may not describe all possible side effects. Call your doctor for medical advice about side effects. You may report side effects to FDA at 1-800-FDA-1088. Where should I keep my medicine? Keep out of the reach of children. Store your medicine at room temperature between 20 and 25 degrees C (68 and 77 degrees F). Throw  away any unused medicine after the expiration date. NOTE: This sheet is a summary. It may not cover all possible information. If you have questions about this medicine, talk to your doctor, pharmacist, or health care provider.  2018 Elsevier/Gold Standard (2012-05-20 13:02:23)

## 2018-03-18 NOTE — Progress Notes (Signed)
Complete Physical  Assessment and Plan: Health Maintenance- Discussed STD testing, safe sex, alcohol and drug awareness, drinking and driving dangers, wearing a seat belt and general safety measures for young adult.  Encounter for routine adult medical exam with abnormal findings  Essential hypertension Initiate medication: olmesartan 40 mg - 1/2-1 tab daily  Monitor blood pressure at home; call if consistently over 130/80 Discussed DASH diet Advised to go to the ER if any CP, SOB, nausea, dizziness, severe HA, changes vision/speech, left arm numbness and tingling and jaw pain. Follow up in 2 months for NV, BP/BMP recheck -     Magnesium -     EKG 12-Lead  Gastroesophageal reflux disease, esophagitis presence not specified Well managed on current medications Discussed diet, avoiding triggers and other lifestyle changes -     Magnesium  Vitamin D deficiency Continue supplementation for goal of 70-100 -     VITAMIN D 25 Hydroxy (Vit-D Deficiency, Fractures)  Screening cholesterol level -     Lipid panel  Medication management -     CBC with Differential/Platelet -     COMPLETE METABOLIC PANEL WITH GFR -     Magnesium  Screening for diabetes mellitus -     Hemoglobin A1c  Screening for hematuria or proteinuria -     Urinalysis w microscopic + reflex cultur  Screening for thyroid disorder -     TSH   Discussed med's effects and SE's. Screening labs and tests as requested with regular follow-up as recommended. Over 40 minutes of exam, counseling, chart review and critical decision making was performed  No future appointments.  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 Patient reports no complaints at this time.   BMI is Body mass index is 32.19 kg/m., he admits to not paying attention to his diet, eats one apple daily but otherwise not much fruits and vegetables.  He works on a farm daily. He currently drinks 12 ounce mountain dews daily. He drinks 3-4 bottles of water and 1-2 bottles of gatorade daily.  Wt Readings from Last 3 Encounters:  03/18/18 244 lb (110.7 kg)  10/06/17 233 lb 9.6 oz (106 kg)  09/15/17 231 lb 6.4 oz (105 kg)   Today their BP is BP: (!) 142/100 He denies chest pain, shortness of breath, dizziness.  he has a diagnosis of GERD which is currently managed by omeprazole 40 mg daily he reports symptoms is currently well controlled, and denies breakthrough reflux, burning in chest, hoarseness or cough.      Current Medications:  Current Outpatient Medications on File Prior to Visit  Medication Sig Dispense Refill  . aspirin 81 MG chewable tablet Chew 81 mg daily by mouth.    . cetirizine (ZYRTEC) 10 MG tablet Take 10 mg by mouth daily.    . Cholecalciferol 5000 units TABS Take 5,000 Units by mouth daily.     Marland Kitchen omeprazole (PRILOSEC) 40 MG capsule Take 1 capsule every morning for Acid Reflux 90 capsule 3  . azelastine (ASTELIN) 0.1 % nasal spray Place 2 sprays 2 (two) times daily into both nostrils. Use in each nostril as directed (Patient not taking: Reported on 03/18/2018) 30 mL 2  .  fexofenadine (ALLEGRA) 30 MG tablet Take 30 mg 2 (two) times daily by mouth.     No current facility-administered medications on file prior to visit.    Health Maintenance:    There is no immunization history on file for this patient.  TD/TDAP: 2017 Influenza: declines Pneumovax: ? HPV vaccines: Had as a teen, 2/2  Sexually Active: yes STD testing offered Last Dental Exam: goes q 6 months, last 2018 Last Eye Exam: Wears contacts, goes annually 2018  Allergies: Not on File Medical History:  has Labile hypertension; GERD  (gastroesophageal reflux disease); Allergic rhinitis due to allergen; Vitamin D deficiency; and Family history of death due to heart problem at 59 years of age or younger on their problem list. Surgical History:  He  has a past surgical history that includes Wisdom tooth extraction (2014). Family History:  Hisfamily history includes Diabetes in his maternal grandmother; Heart attack (age of onset: 68) in his paternal uncle; Heart attack (age of onset: 3) in his paternal grandfather; Heart disease in his father; Heart failure (age of onset: 44) in his paternal grandmother; Hypertension in his father; Hypothyroidism in his mother. Social History:   reports that he has never smoked. He uses smokeless tobacco. He reports that he does not drink alcohol or use drugs.  Review of Systems: Review of Systems  Constitutional: Negative for malaise/fatigue and weight loss.  HENT: Negative for hearing loss and tinnitus.   Eyes: Negative for blurred vision and double vision.  Respiratory: Negative for cough, sputum production, shortness of breath and wheezing.   Cardiovascular: Negative for chest pain, palpitations, orthopnea, claudication, leg swelling and PND.  Gastrointestinal: Negative for abdominal pain, blood in stool, constipation, diarrhea, heartburn, melena, nausea and vomiting.  Genitourinary: Negative.   Musculoskeletal: Negative for falls, joint pain and myalgias.  Skin: Negative for rash.  Neurological: Negative for dizziness, tingling, sensory change, weakness and headaches.  Endo/Heme/Allergies: Negative for polydipsia.  Psychiatric/Behavioral: Negative.  Negative for depression, memory loss, substance abuse and suicidal ideas. The patient is not nervous/anxious and does not have insomnia.   All other systems reviewed and are negative.   Physical Exam: Estimated body mass index is 32.19 kg/m as calculated from the following:   Height as of this encounter:  (1.854 m).   Weight as of  this encounter: 244 lb (110.7 kg). BP (!) 142/100   Pulse 80   Temp (!) 97.5 F (36.4 C)   Ht  (1.854 m)   Wt 244 lb (110.7 kg)   SpO2 97%   BMI 32.19 kg/m  General Appearance: Well nourished, in no apparent distress.  Eyes: PERRLA, EOMs, conjunctiva no swelling or erythema, normal fundi and vessels.  Sinuses: No Frontal/maxillary tenderness  ENT/Mouth: Ext aud canals clear, normal light reflex with TMs without erythema, bulging. Good dentition. No erythema, swelling, or exudate on post pharynx. Tonsils not swollen or erythematous. Hearing normal.  Neck: Supple, thyroid normal. No bruits  Respiratory: Respiratory effort normal, BS equal bilaterally without rales, rhonchi, wheezing or stridor.  Cardio: RRR without murmurs, rubs or gallops. Brisk peripheral pulses without edema.  Chest: symmetric, with normal excursions and percussion.  Abdomen: Soft, nontender, no guarding, rebound, hernias, masses, or organomegaly.  Lymphatics: Non tender without lymphadenopathy.  Genitourinary: defer Musculoskeletal: Full ROM all peripheral extremities,5/5 strength, and normal gait.  Skin: Warm, dry without rashes, lesions, ecchymosis. Neuro: Cranial nerves intact, reflexes equal bilaterally. Normal muscle tone, no cerebellar symptoms. Sensation intact.  Psych: Awake and oriented X  3, normal affect, Insight and Judgment appropriate.   EKG: obtained for baseline  Dan Maker 12:08 PM [facility]

## 2018-03-19 ENCOUNTER — Encounter: Payer: Self-pay | Admitting: Adult Health

## 2018-03-19 DIAGNOSIS — E785 Hyperlipidemia, unspecified: Secondary | ICD-10-CM | POA: Insufficient documentation

## 2018-03-19 LAB — LIPID PANEL
Cholesterol: 216 mg/dL — ABNORMAL HIGH (ref <200)
HDL: 38 mg/dL — ABNORMAL LOW (ref >40)
LDL Cholesterol (Calc): 128 mg/dL (calc) — ABNORMAL HIGH
NON-HDL CHOLESTEROL (CALC): 178 mg/dL — AB (ref <130)
Total CHOL/HDL Ratio: 5.7 (calc) — ABNORMAL HIGH (ref <5.0)
Triglycerides: 350 mg/dL — ABNORMAL HIGH (ref <150)

## 2018-03-19 LAB — URINALYSIS W MICROSCOPIC + REFLEX CULTURE
BACTERIA UA: NONE SEEN /HPF
Bilirubin Urine: NEGATIVE
Glucose, UA: NEGATIVE
HGB URINE DIPSTICK: NEGATIVE
HYALINE CAST: NONE SEEN /LPF
Ketones, ur: NEGATIVE
Leukocyte Esterase: NEGATIVE
Nitrites, Initial: NEGATIVE
PROTEIN: NEGATIVE
RBC / HPF: NONE SEEN /HPF (ref 0–2)
Specific Gravity, Urine: 1.021 (ref 1.001–1.03)
Squamous Epithelial / LPF: NONE SEEN /HPF (ref <=5)
WBC UA: NONE SEEN /HPF (ref 0–5)

## 2018-03-19 LAB — COMPLETE METABOLIC PANEL WITH GFR
AG RATIO: 1.7 (calc) (ref 1.0–2.5)
ALBUMIN MSPROF: 5 g/dL (ref 3.6–5.1)
ALKALINE PHOSPHATASE (APISO): 114 U/L (ref 40–115)
ALT: 46 U/L (ref 9–46)
AST: 34 U/L (ref 10–40)
BILIRUBIN TOTAL: 0.9 mg/dL (ref 0.2–1.2)
BUN: 8 mg/dL (ref 7–25)
CHLORIDE: 104 mmol/L (ref 98–110)
CO2: 27 mmol/L (ref 20–32)
Calcium: 10 mg/dL (ref 8.6–10.3)
Creat: 0.88 mg/dL (ref 0.60–1.35)
GFR, EST AFRICAN AMERICAN: 139 mL/min/{1.73_m2} (ref >=60)
GFR, Est Non African American: 120 mL/min/{1.73_m2} (ref >=60)
GLOBULIN: 2.9 g/dL (ref 1.9–3.7)
Glucose, Bld: 90 mg/dL (ref 65–99)
Potassium: 4.3 mmol/L (ref 3.5–5.3)
SODIUM: 140 mmol/L (ref 135–146)
TOTAL PROTEIN: 7.9 g/dL (ref 6.1–8.1)

## 2018-03-19 LAB — HEMOGLOBIN A1C
HEMOGLOBIN A1C: 4.9 %{Hb} (ref <5.7)
MEAN PLASMA GLUCOSE: 94 (calc)
eAG (mmol/L): 5.2 (calc)

## 2018-03-19 LAB — VITAMIN D 25 HYDROXY (VIT D DEFICIENCY, FRACTURES): VIT D 25 HYDROXY: 72 ng/mL (ref 30–100)

## 2018-03-19 LAB — MAGNESIUM: Magnesium: 2.1 mg/dL (ref 1.5–2.5)

## 2018-03-19 LAB — NO CULTURE INDICATED

## 2018-05-20 ENCOUNTER — Ambulatory Visit (INDEPENDENT_AMBULATORY_CARE_PROVIDER_SITE_OTHER): Payer: BLUE CROSS/BLUE SHIELD | Admitting: *Deleted

## 2018-05-20 VITALS — BP 132/84 | HR 72 | Temp 97.3°F | Resp 18 | Wt 238.2 lb

## 2018-05-20 DIAGNOSIS — Z79899 Other long term (current) drug therapy: Secondary | ICD-10-CM | POA: Diagnosis not present

## 2018-05-20 DIAGNOSIS — I10 Essential (primary) hypertension: Secondary | ICD-10-CM | POA: Diagnosis not present

## 2018-05-20 LAB — BASIC METABOLIC PANEL
BUN: 9 mg/dL (ref 7–25)
CALCIUM: 9.7 mg/dL (ref 8.6–10.3)
CO2: 28 mmol/L (ref 20–32)
Chloride: 102 mmol/L (ref 98–110)
Creat: 0.92 mg/dL (ref 0.60–1.35)
GLUCOSE: 94 mg/dL (ref 65–99)
Potassium: 4.3 mmol/L (ref 3.5–5.3)
Sodium: 139 mmol/L (ref 135–146)

## 2018-05-20 NOTE — Progress Notes (Signed)
Patient ID: Jaime RiegerMatthew V Kingry, male   DOB: July 15, 1991, 27 y.o.   MRN: [id number]  Patient is here for a NV to recheck his BP, weight and a BMETatient is here for a BMET. The patient's BP is 132/84, pulse 72 and weight 238.2 lb  He is taking Benicar 40 mg 1/2 tablet daily.  Per Morrie SheldonAshley Corbett,NP, the patient should increase the Benicar 40 mg to 1 tablet daily. If the BP drops below 110/60 and he is dizzy, he can go back to 1/2 tablet daily. He has a follow up in 06/2018 with Judd GaudierAshley Corbett.

## 2018-06-03 DIAGNOSIS — E669 Obesity, unspecified: Secondary | ICD-10-CM | POA: Insufficient documentation

## 2018-06-03 NOTE — Progress Notes (Signed)
Assessment and Plan:  Jaime Little was seen today for shoulder pain and back pain.  Diagnoses and all orders for this visit:  Essential hypertension Continue medication: olmesartan 40 mg daily Monitor blood pressure at home; call if consistently over 130/80 Continue DASH diet.   Reminder to go to the ER if any CP, SOB, nausea, dizziness, severe HA, changes vision/speech, left arm numbness and tingling and jaw pain.  Aches Patient mainly concerned about his heart, reassured, discussed warning signs of cardiac type pain.  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 Discussed med's effects and SE's.   Over 15 minutes of exam, counseling, chart review, and critical decision making was performed.   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 Reports brief pains, may last ~5 seconds then go away, feels like a brief dull ache, will have 1 spot at a time, pain is 1/10, enough to notice but not enough to do anything about.  Denies having any pain at this time. He reports he did have some pains in his back this morning. "just wanted to make sure I was ok." He did try taking a tylenol today but couldn't tell if this made a difference.   He denies dyspnea, chest pressure, dizziness, syncope, palpitations, calf pain, nausea  He is not a smoker, on ASA  No past medical history on file.   Not on  File  Current Outpatient Medications on File Prior to Visit  Medication Sig  . aspirin 81 MG chewable tablet Chew 81 mg daily by mouth.  . cetirizine (ZYRTEC) 10 MG tablet Take 10 mg by mouth daily.  . Cholecalciferol 5000 units TABS Take 5,000 Units by mouth daily.   . fexofenadine (ALLEGRA) 30 MG tablet Take 30 mg 2 (two) times daily by mouth.  . olmesartan (BENICAR) 40 MG tablet Take 1/2-1 tab daily for BP goal <130/80  . omeprazole (PRILOSEC) 40 MG capsule Take 1 capsule every morning for Acid Reflux   No current facility-administered medications on file prior to visit.     ROS: all negative except above.   Physical Exam:  BP 118/84   Pulse (!) 101   Temp (!) 97.3 F (36.3 C)   Ht 6\' 1"  (1.854 m)   Wt 235 lb (106.6 kg)   SpO2 98%   BMI 31.00 kg/m   General Appearance: Well nourished, in no apparent distress. Eyes: PERRLA, conjunctiva no swelling or erythema ENT/Mouth:  Hearing normal.  Neck: Supple, thyroid normal.  Respiratory: Respiratory effort normal, BS equal bilaterally without rales, rhonchi, wheezing or stridor.  Cardio: RRR with no MRGs. Brisk peripheral pulses without edema.  Abdomen: Soft, + BS.  Non tender. Lymphatics: Non tender without lymphadenopathy.  Musculoskeletal: Full ROM, 5/5 strength, normal gait. Non tender, no palpable or visible abnormalities.  Skin: Warm, dry without rashes, lesions, ecchymosis.  Neuro: Cranial nerves intact. Normal muscle tone, no cerebellar symptoms. Sensation intact.  Psych: Awake and oriented  X 3, normal affect, Insight and Judgment appropriate.    Dan Maker, NP 4:04 PM [facility]

## 2018-06-04 ENCOUNTER — Encounter: Payer: Self-pay | Admitting: Adult Health

## 2018-06-04 ENCOUNTER — Ambulatory Visit: Payer: BLUE CROSS/BLUE SHIELD | Admitting: Adult Health

## 2018-06-04 VITALS — BP 118/84 | HR 101 | Temp 97.3°F | Ht 73.0 in | Wt 235.0 lb

## 2018-06-04 DIAGNOSIS — J3089 Other allergic rhinitis: Secondary | ICD-10-CM | POA: Diagnosis not present

## 2018-06-04 DIAGNOSIS — R52 Pain, unspecified: Secondary | ICD-10-CM | POA: Diagnosis not present

## 2018-06-04 DIAGNOSIS — I10 Essential (primary) hypertension: Secondary | ICD-10-CM | POA: Diagnosis not present

## 2018-06-04 MED ORDER — AZELASTINE HCL 0.1 % NA SOLN: 2.0000 | Freq: Two times a day (BID) | NASAL | 2 refills | Status: DC

## 2018-06-04 NOTE — Patient Instructions (Signed)
Heart Attack °A heart attack (myocardial infarction, MI) is a condition that occurs when an artery in the heart (coronary artery) becomes narrowed or blocked. The narrowing or blockage cuts off the blood and oxygen supply to the heart, which permanently damages the heart. When one or more of your coronary arteries becomes blocked, that area of your heart begins to die. This causes symptoms felt during a heart attack. °A heart attack is a medical emergency. If you think you are having a heart attack, do not wait to see if the symptoms will go away. Get medical help right away. °What are the causes? °Many conditions can cause a heart attack, including: °· Atherosclerosis. This is when a fatty substance (plaque) gradually builds up in the arteries. This buildup can block or reduce blood supply to one or more of the coronary arteries. This is the most common cause of heart attack. °· A blood clot. A blood clot can develop suddenly when plaque breaks up (ruptures) within a coronary artery. A blood clot can block the artery, which prevents blood flow to the heart. °· Low blood pressure (hypotension). °· An abnormal heart beat (arrhythmia). °· Severe tightening (spasm) of a coronary artery. This cuts off blood flow through the artery. °· Tearing of a coronary artery (spontaneous coronary artery dissection). ° °What increases the risk? °The following factors may make you more likely to develop this condition: °· Age. The older a person is the higher the risk. °· A history of heart attack or stroke. °· Being male. °· A family history of chest pain, heart disease, or stroke. °· Smoking. °· Lack of exercise. °· Being overweight or obese. °· High blood pressure (hypertension). °· High cholesterol. °· Diabetes. °· Stress. °· Drinking too much alcohol. °· Using drugs, such as cocaine or methamphetamine. ° °What are the signs or symptoms? °Symptoms of this condition may vary depending on factors like gender and age. Symptoms may  include: °· Chest pain. This may feel like crushing, squeezing, tightness, or pressure. °· Pain in the arm, neck, jaw, back, or upper body. °· Shortness of breath. This may be at rest or with minimal activity. °· Heartburn or indigestion. °· Nausea. °· Sudden cold sweats. °· Feeling tired. °· Sudden light-headedness. ° °Older people may have more subtle symptoms, such as general tiredness or not feeling well. °How is this diagnosed? °This condition may be diagnosed through tests, such as: °· Electrocardiogram (ECG) to measure the electrical activity of your heart. °· Blood tests in which blood is drawn at scheduled times over several hours to check for the specific proteins or enzymes released by damaged heart muscle (cardiac markers). °· Coronary angiogram. This is a procedure in which dye is injected into arteries and then X-rays are taken to evaluate blood flow and heart function. °· CT scan of the chest, using dye to make it easy to see the heart. °· Echocardiogram to evaluate heart motion and blood flow. ° °How is this treated? °A heart attack must be treated as soon as possible. Treatment may include: °· Oxygen therapy. °· Medicines, such as: °? Medicine that breaks up or dissolves blood clots in the coronary artery (fibrinolytic therapy). °? Antiplatelet medicines and blood-thinning medicines, such as aspirin. These help prevent blood clots. °? Blood pressure medicines. °? Nitroglycerin. This medicine improves blood flow to the heart. °? Pain medicine. °? Cholesterol-reducing medicine. °· Angioplasty and cardiac stent placement. This is a procedure to widen a blocked coronary artery by placing a small   piece of metal that looks like a coil or spring (stent) into the artery. The stent helps keep the artery open by supporting the artery walls. °· Open heart surgery (coronary artery bypass graft, CABG). In this procedure, a section of blood vessel from another part of the body (graft) is removed (harvested) and  then inserted where it will allow blood to go around (bypass) the damaged part of the coronary artery. °· Cardiac rehabilitation. This is a program that helps improve your health and well-being. It includes exercise training, education, and counseling to help you recover. ° °Follow these instructions at home: °Medicines °· Take over-the-counter and prescription medicines only as told by your health care provider. You may need to take medicine after a heart attack to: °? Keep your blood from clotting too easily. °? Control your blood pressure and heart rate. °? Lower your cholesterol. °? Control abnormal heart rhythms. °· Do not take the following medicines unless your health care provider says it is okay to take them: °? NSAIDs, such as ibuprofen, naproxen, or celecoxib (COX-2 inhibitors). °? Vitamin supplements that contain vitamin A, vitamin E, or both. °? Hormone replacement therapy that contains estrogen with or without progestin. °Lifestyle °· Do not use any products that contain nicotine or tobacco, such as cigarettes and e-cigarettes. If you need help quitting, ask your health care provider. °· Avoid secondhand smoke. °· Exercise regularly. Ask your health care provider about participating in a cardiac rehabilitation program that helps you start exercising safely after a heart attack. °· Eat a heart-healthy diet. Consider working with a diet and nutrition specialist (registered dietitian). A dietitian can help you learn healthy eating options. °· Maintain a healthy weight. °· Reduce your stress level. °· Limit alcohol intake to no more than 1 drink a day for non-pregnant women and 2 drinks a day for men. One drink equals 12 oz. of beer, 5 oz. of wine, or 1½ oz. of hard liquor. °General instructions °· Work with your health care provider to manage any other conditions you have, such as hypertension or diabetes. These conditions affect your heart. °· Get screened for depression, and seek treatment if  needed. °· Keep your vaccinations up to date. Get the flu (influenza) vaccine every year. °· Keep all follow-up visits as told by your health care provider. This is important. °Get help right away if: °· You have sudden, unexplained discomfort in your chest, arms, back, neck, jaw, or upper body. °· You have shortness of breath at any time. °· You suddenly start to sweat or your skin gets clammy. °· You feel nauseous or you vomit. °· You suddenly feel light-headed or dizzy. °· Your heart starts to beat fast or feels like it is skipping beats. °· Your blood pressure is higher than 180/120. °· You have thoughts about harming yourself or taking your own life. °These symptoms may represent a serious problem that is an emergency. Do not wait to see if the symptoms will go away. Get medical help right away. Call your local emergency services (911 in the [place] Do not drive yourself to the hospital. °Summary °· A heart attack (myocardial infarction, MI) is a condition that occurs when an artery in the heart (coronary artery) becomes narrowed or blocked. The narrowing or blockage cuts off the blood and oxygen supply to the heart, which permanently damages the heart. °· A heart attack is an emergency. Get help right away if you have sudden pain in your chest, arms, back, jaw, or   upper body. Seek help if you have nausea or you vomit, or you become lightheaded or dizzy. °· Treatment is a combination of oxygen, medicines and procedures, if needed, to open the blocked artery and restore blood flow to the heart. °This information is not intended to replace advice given to you by your health care provider. Make sure you discuss any questions you have with your health care provider. °Document Released: 11/04/2005 Document Revised: 12/10/2016 Document Reviewed: 12/10/2016 °Elsevier Interactive Patient Education © 2018 [employer] Inc[employer] ° °

## 2018-06-12 ENCOUNTER — Other Ambulatory Visit: Payer: Self-pay | Admitting: Internal Medicine

## 2018-06-12 DIAGNOSIS — K219 Gastro-esophageal reflux disease without esophagitis: Secondary | ICD-10-CM

## 2018-07-02 NOTE — Progress Notes (Signed)
FOLLOW UP  Assessment and Plan:   Hypertension Well controlled with current medications  Monitor blood pressure at home; patient to call if consistently greater than 130/80 Continue DASH diet.   Reminder to go to the ER if any CP, SOB, nausea, dizziness, severe HA, changes vision/speech, left arm numbness and tingling and jaw pain.  Cholesterol Currently above goal; working on lifestyle Continue low cholesterol diet and exercise.  Check lipid panel.   Obesity with co morbidities Commended progress with weight loss- Long discussion about weight loss, diet, and exercise Recommended diet heavy in fruits and veggies and low in animal meats, cheeses, and dairy products, appropriate calorie intake Discussed ideal weight for height  Patient will work on watching portions, cholesterol restricted diet, limiting sugar intake Will follow up in 3 months  Vitamin D Def At goal at last visit; continue supplementation to maintain goal of 70-100 Defer Vit D level  Continue diet and meds as discussed. Further disposition pending results of labs. Discussed med's effects and SE's.   Over 30 minutes of exam, counseling, chart review, and critical decision making was performed.   No future appointments.  ----------------------------------------------------------------------------------------------------------------------  HPI 27 y.o. male  presents for 3 month follow up on hypertension, cholesterol, weight and vitamin D deficiency.   BMI is Body mass index is 30.58 kg/m., he has been working on diet and exercise. He has cut out all soda; still having 1 glass of sweet tea in the evenings.  Wt Readings from Last 3 Encounters:  07/03/18 231 lb 12.8 oz (105.1 kg)  06/04/18 235 lb (106.6 kg)  05/20/18 238 lb 3.2 oz (108 kg)   His blood pressure has been controlled at home, today their BP is BP: 110/80  He does workout. He denies chest pain, shortness of breath, dizziness.   He is not on  cholesterol medication and denies myalgias. His cholesterol is not at goal. The cholesterol last visit was:   Lab Results  Component Value Date   CHOL 216 (H) 03/18/2018   HDL 38 (L) 03/18/2018   LDLCALC 128 (H) 03/18/2018   TRIG 350 (H) 03/18/2018   CHOLHDL 5.7 (H) 03/18/2018   Last A1C in the office was:  Lab Results  Component Value Date   HGBA1C 4.9 03/18/2018   Patient is on Vitamin D supplement.   Lab Results  Component Value Date   VD25OH 72 03/18/2018       Current Medications:  Current Outpatient Medications on File Prior to Visit  Medication Sig  . aspirin 81 MG chewable tablet Chew 81 mg daily by mouth.  Marland Kitchen. azelastine (ASTELIN) 0.1 % nasal spray Place 2 sprays into both nostrils 2 (two) times daily. Use in each nostril as directed  . Cholecalciferol 5000 units TABS Take 5,000 Units by mouth daily.   . Levocetirizine Dihydrochloride (XYZAL ALLERGY 24HR PO) Take by mouth.  . olmesartan (BENICAR) 40 MG tablet Take 1/2-1 tab daily for BP goal <130/80  . omeprazole (PRILOSEC) 40 MG capsule TAKE 1 CAPSULE EVERY MORNING FOR ACID REFLUX  . Saccharomyces boulardii (FLORASTOR PO) Take by mouth.   No current facility-administered medications on file prior to visit.      Allergies: Not on File   Medical History: No past medical history on file. Family history- Reviewed and unchanged Social history- Reviewed and unchanged   Review of Systems:  Review of Systems  Constitutional: Negative for malaise/fatigue and weight loss.  HENT: Negative for hearing loss and tinnitus.   Eyes: Negative  for blurred vision and double vision.  Respiratory: Negative for cough, shortness of breath and wheezing.   Cardiovascular: Negative for chest pain, palpitations, orthopnea, claudication and leg swelling.  Gastrointestinal: Negative for abdominal pain, blood in stool, constipation, diarrhea, heartburn, melena, nausea and vomiting.  Genitourinary: Negative.   Musculoskeletal: Negative  for joint pain and myalgias.  Skin: Negative for rash.  Neurological: Negative for dizziness, tingling, sensory change, weakness and headaches.  Endo/Heme/Allergies: Negative for polydipsia.  Psychiatric/Behavioral: Negative.   All other systems reviewed and are negative.    Physical Exam: BP 110/80   Pulse 96   Temp 97.9 F (36.6 C)   Ht 6\' 1"  (1.854 m)   Wt 231 lb 12.8 oz (105.1 kg)   SpO2 98%   BMI 30.58 kg/m  Wt Readings from Last 3 Encounters:  07/03/18 231 lb 12.8 oz (105.1 kg)  06/04/18 235 lb (106.6 kg)  05/20/18 238 lb 3.2 oz (108 kg)   General Appearance: Well nourished, in no apparent distress. Eyes: PERRLA, EOMs, conjunctiva no swelling or erythema Sinuses: No Frontal/maxillary tenderness ENT/Mouth: Ext aud canals clear, TMs without erythema, bulging. No erythema, swelling, or exudate on post pharynx.  Tonsils not swollen or erythematous. Hearing normal.  Neck: Supple, thyroid normal.  Respiratory: Respiratory effort normal, BS equal bilaterally without rales, rhonchi, wheezing or stridor.  Cardio: RRR with no MRGs. Brisk peripheral pulses without edema.  Abdomen: Soft, + BS.  Non tender, no guarding, rebound, hernias, masses. Lymphatics: Non tender without lymphadenopathy.  Musculoskeletal: Full ROM, 5/5 strength, Normal gait Skin: Warm, dry without rashes, lesions, ecchymosis.  Neuro: Cranial nerves intact. No cerebellar symptoms.  Psych: Awake and oriented X 3, normal affect, Insight and Judgment appropriate.    Dan MakerAshley C Jurnie Garritano, NP 9:29 AM [facility]

## 2018-07-03 ENCOUNTER — Ambulatory Visit: Payer: BLUE CROSS/BLUE SHIELD | Admitting: Adult Health

## 2018-07-03 ENCOUNTER — Encounter: Payer: Self-pay | Admitting: Adult Health

## 2018-07-03 VITALS — BP 110/80 | HR 96 | Temp 97.9°F | Ht 73.0 in | Wt 231.8 lb

## 2018-07-03 DIAGNOSIS — E559 Vitamin D deficiency, unspecified: Secondary | ICD-10-CM

## 2018-07-03 DIAGNOSIS — E782 Mixed hyperlipidemia: Secondary | ICD-10-CM | POA: Diagnosis not present

## 2018-07-03 DIAGNOSIS — Z79899 Other long term (current) drug therapy: Secondary | ICD-10-CM | POA: Diagnosis not present

## 2018-07-03 DIAGNOSIS — E669 Obesity, unspecified: Secondary | ICD-10-CM

## 2018-07-03 DIAGNOSIS — I10 Essential (primary) hypertension: Secondary | ICD-10-CM

## 2018-07-03 DIAGNOSIS — K219 Gastro-esophageal reflux disease without esophagitis: Secondary | ICD-10-CM | POA: Diagnosis not present

## 2018-07-03 NOTE — Patient Instructions (Signed)
Think about wearing UV protection when you are outdoors- can find shirts with protection, use sunscreen daily (with zinc oxide and/or titanium dioxide) and wear UV protection eyewear    Aim for 5-10 servings of fruits and vegetables daily  65-80+ fluid ounces of water or unsweet tea for healthy kidneys, limit sweet beverages  Limit to max 1 drink of alcohol per day; avoid smoking/tobacco  Limit animal fats in diet for cholesterol and heart health - choose grass fed whenever available  Avoid highly processed foods, and foods high in saturated/trans fats  Aim for low stress - take time to unwind and care for your mental health  Aim for 150 min of moderate intensity exercise weekly for heart health, and weights twice weekly for bone health  Aim for 7-9 hours of sleep daily   Preventing High Cholesterol Cholesterol is a waxy, fat-like substance that your body needs in small amounts. Your liver makes all the cholesterol that your body needs. Having high cholesterol (hypercholesterolemia) increases your risk for heart disease and stroke. Extra (excess) cholesterol comes from the food you eat, such as animal-based fat (saturated fat) from meat and some dairy products. High cholesterol can often be prevented with diet and lifestyle changes. If you already have high cholesterol, you can control it with diet and lifestyle changes, as well as medicine. What nutrition changes can be made?  Eat less saturated fat. Foods that contain saturated fat include red meat and some dairy products.  Avoid processed meats, like bacon and lunch meats.  Avoid trans fats, which are found in margarine and some baked goods.  Avoid foods and beverages that have added sugars.  Eat more fruits, vegetables, and whole grains.  Choose healthy sources of protein, such as fish, poultry, and nuts.  Choose healthy sources of fat, such as: ? Nuts. ? Vegetable oils, especially olive oil. ? Fish that have healthy fats  (omega-3 fatty acids), such as mackerel or salmon. What lifestyle changes can be made?  Lose weight if you are overweight. Losing 5-10 lb (2.3-4.5 kg) can help prevent or control high cholesterol and reduce your risk for diabetes and high blood pressure. Ask your health care provider to help you with a diet and exercise plan to safely lose weight.  Get enough exercise. Do at least 150 minutes of moderate-intensity exercise each week. ? You could do this in short exercise sessions several times a day, or you could do longer exercise sessions a few times a week. For example, you could take a brisk 10-minute walk or bike ride, 3 times a day, for 5 days a week.  Do not smoke. If you need help quitting, ask your health care provider.  Limit your alcohol intake. If you drink alcohol, limit alcohol intake to no more than 1 drink a day for nonpregnant women and 2 drinks a day for men. One drink equals 12 oz of beer, 5 oz of wine, or 1 oz of hard liquor. Why are these changes important? If you have high cholesterol, deposits (plaques) may build up on the walls of your blood vessels. Plaques make the arteries narrower and stiffer, which can restrict or block blood flow and cause blood clots to form. This greatly increases your risk for heart attack and stroke. Making diet and lifestyle changes can reduce your risk for these life-threatening conditions. What can I do to lower my risk?  Manage your risk factors for high cholesterol. Talk with your health care provider about all of your  risk factors and how to lower your risk.  Manage other conditions that you have, such as diabetes or high blood pressure (hypertension).  Have your cholesterol checked at regular intervals.  Keep all follow-up visits as told by your health care provider. This is important. How is this treated? In addition to diet and lifestyle changes, your health care provider may recommend medicines to help lower cholesterol, such as a  medicine to reduce the amount of cholesterol made in your liver. You may need medicine if:  Diet and lifestyle changes do not lower your cholesterol enough.  You have high cholesterol and other risk factors for heart disease or stroke.  Take over-the-counter and prescription medicines only as told by your health care provider. Where to find more information:  American Heart Association: 1122334455www.heart.org/HEARTORG/Conditions/Cholesterol/Cholesterol_UCM_001089_SubHomePage.jsp  National Heart, Lung, and Blood Institute: http://hood.com/www.nhlbi.nih.gov/health/resources/heart/heart-cholesterol-hbc-what-html Summary  High cholesterol increases your risk for heart disease and stroke. By keeping your cholesterol level low, you can reduce your risk for these conditions.  Diet and lifestyle changes are the most important steps in preventing high cholesterol.  Work with your health care provider to manage your risk factors, and have your blood tested regularly. This information is not intended to replace advice given to you by your health care provider. Make sure you discuss any questions you have with your health care provider. Document Released: 11/19/2015 Document Revised: 07/13/2016 Document Reviewed: 07/13/2016 Elsevier Interactive Patient Education  [employer]2018 [employer] Inc.

## 2018-07-04 LAB — CBC WITH DIFFERENTIAL/PLATELET
BASOS PCT: 1.1 %
Basophils Absolute: 72 cells/uL (ref 0–200)
EOS ABS: 98 {cells}/uL (ref 15–500)
Eosinophils Relative: 1.5 %
HCT: 44.7 % (ref 38.5–50.0)
Hemoglobin: 15.2 g/dL (ref 13.2–17.1)
LYMPHS ABS: 1788 {cells}/uL (ref 850–3900)
MCH: 28.4 pg (ref 27.0–33.0)
MCHC: 34 g/dL (ref 32.0–36.0)
MCV: 83.6 fL (ref 80.0–100.0)
MONOS PCT: 8 %
MPV: 10.7 fL (ref 7.5–12.5)
Neutro Abs: 4024 cells/uL (ref 1500–7800)
Neutrophils Relative %: 61.9 %
Platelets: 266 10*3/uL (ref 140–400)
RBC: 5.35 10*6/uL (ref 4.20–5.80)
RDW: 12.7 % (ref 11.0–15.0)
Total Lymphocyte: 27.5 %
WBC: 6.5 10*3/uL (ref 3.8–10.8)
WBCMIX: 520 {cells}/uL (ref 200–950)

## 2018-07-04 LAB — LIPID PANEL
CHOL/HDL RATIO: 6.1 (calc) — AB (ref <5.0)
CHOLESTEROL: 188 mg/dL (ref <200)
HDL: 31 mg/dL — ABNORMAL LOW (ref >40)
Non-HDL Cholesterol (Calc): 157 mg/dL (calc) — ABNORMAL HIGH (ref <130)
TRIGLYCERIDES: 404 mg/dL — AB (ref <150)

## 2018-07-04 LAB — COMPLETE METABOLIC PANEL WITH GFR
AG Ratio: 1.6 (calc) (ref 1.0–2.5)
ALBUMIN MSPROF: 4.5 g/dL (ref 3.6–5.1)
ALT: 29 U/L (ref 9–46)
AST: 22 U/L (ref 10–40)
Alkaline phosphatase (APISO): 104 U/L (ref 40–115)
BUN: 11 mg/dL (ref 7–25)
CALCIUM: 9.7 mg/dL (ref 8.6–10.3)
CO2: 29 mmol/L (ref 20–32)
CREATININE: 0.98 mg/dL (ref 0.60–1.35)
Chloride: 103 mmol/L (ref 98–110)
GFR, EST NON AFRICAN AMERICAN: 107 mL/min/{1.73_m2} (ref >=60)
GFR, Est African American: 124 mL/min/{1.73_m2} (ref >=60)
Globulin: 2.8 g/dL (calc) (ref 1.9–3.7)
Glucose, Bld: 108 mg/dL — ABNORMAL HIGH (ref 65–99)
Potassium: 4.1 mmol/L (ref 3.5–5.3)
Sodium: 140 mmol/L (ref 135–146)
Total Bilirubin: 0.5 mg/dL (ref 0.2–1.2)
Total Protein: 7.3 g/dL (ref 6.1–8.1)

## 2018-07-04 LAB — TSH: TSH: 1.35 m[IU]/L (ref 0.40–4.50)

## 2018-07-04 LAB — MAGNESIUM: Magnesium: 2 mg/dL (ref 1.5–2.5)

## 2018-08-27 ENCOUNTER — Other Ambulatory Visit: Payer: Self-pay | Admitting: Adult Health

## 2018-08-27 DIAGNOSIS — J3089 Other allergic rhinitis: Secondary | ICD-10-CM

## 2018-08-27 IMAGING — CT CT HEAD W/O CM
3 of 4 series · 16 of 47 positions shown, 19 images · non-contrast
Comparison: None.

CLINICAL DATA: 26 y/o M; head injury [DATE]. Presenting with headache
and pressure.

EXAM:
CT HEAD WITHOUT CONTRAST
TECHNIQUE: Contiguous axial images were obtained from the base of the skull
through the vertex without intravenous contrast.

[Series 32: 3d filtered head w/o · axial · non-contrast · 0.46mm/px · z∈[+13,+138]mm · 10 of 31 slices shown, 13 images]
[im 3/31  brain]
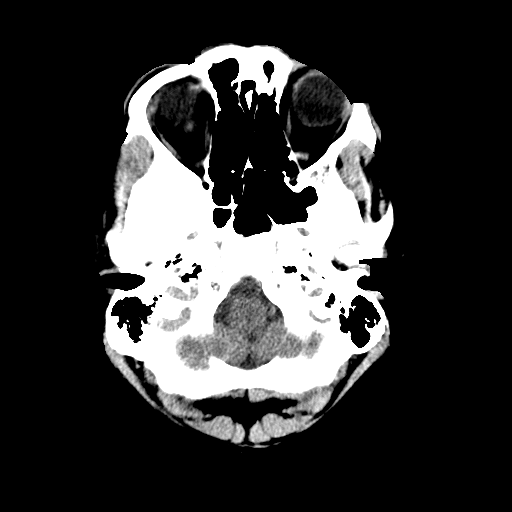
[im 3/31  bone]
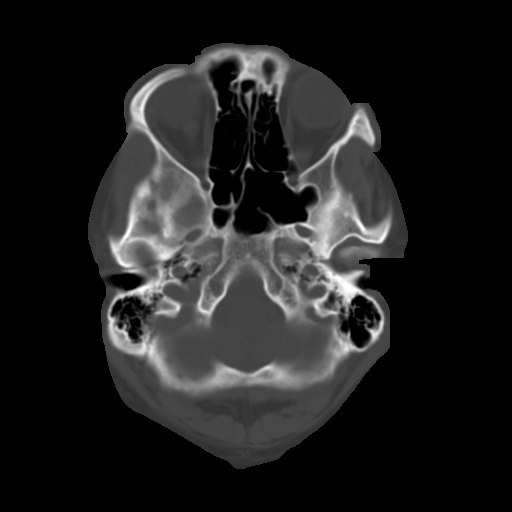
[im 5/31  brain]
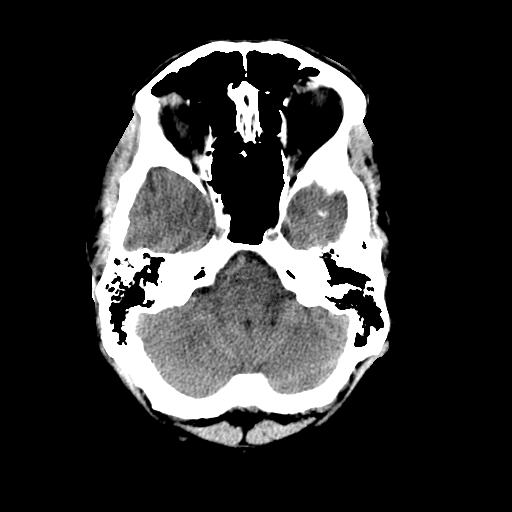
[im 9/31  brain]
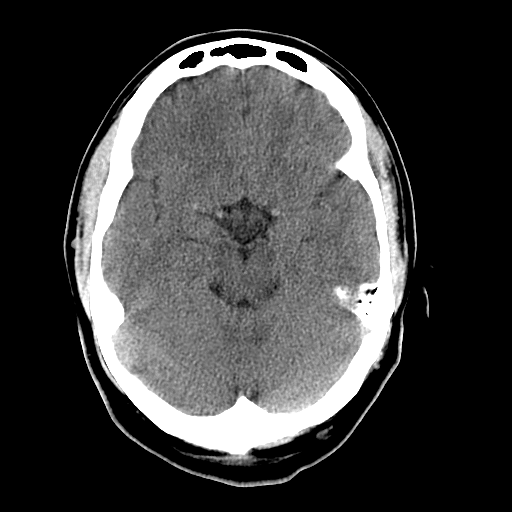
[im 11/31  brain]
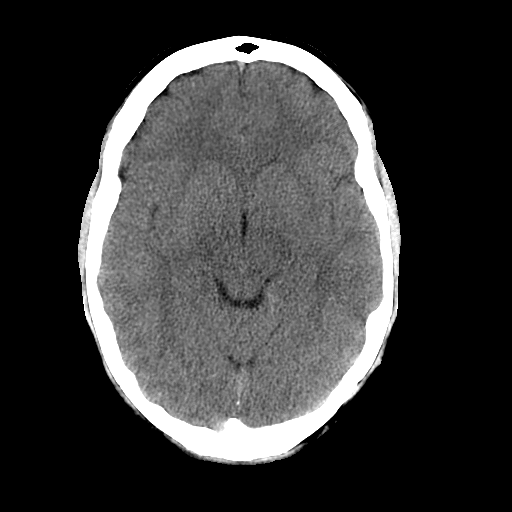
[im 13/31  brain]
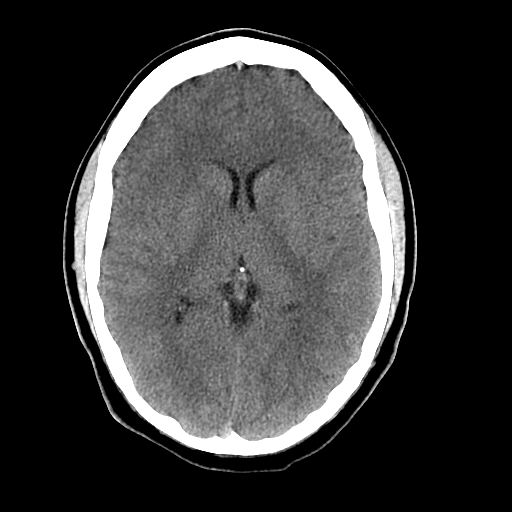
[im 13/31  bone]
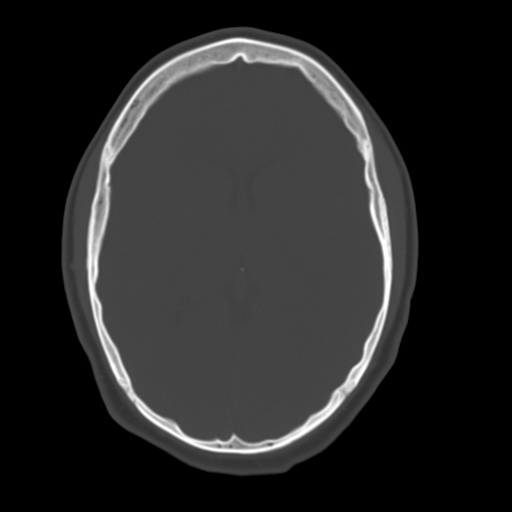
[im 18/31  brain]
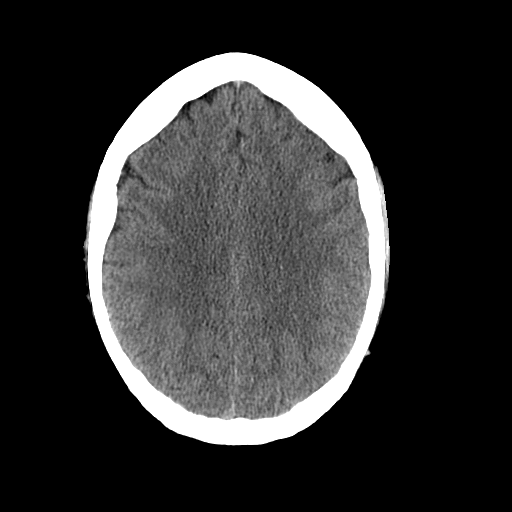
[im 20/31  brain]
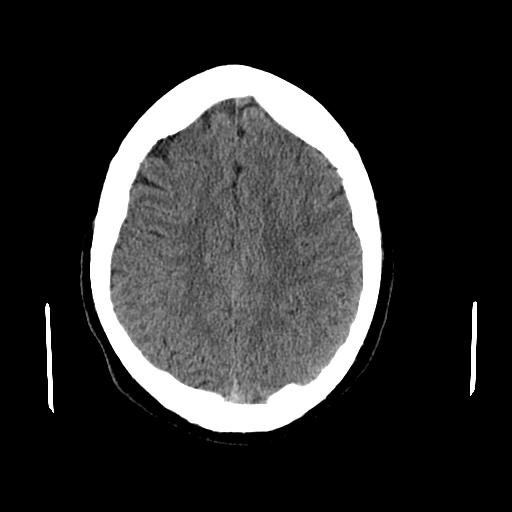
[im 22/31  brain]
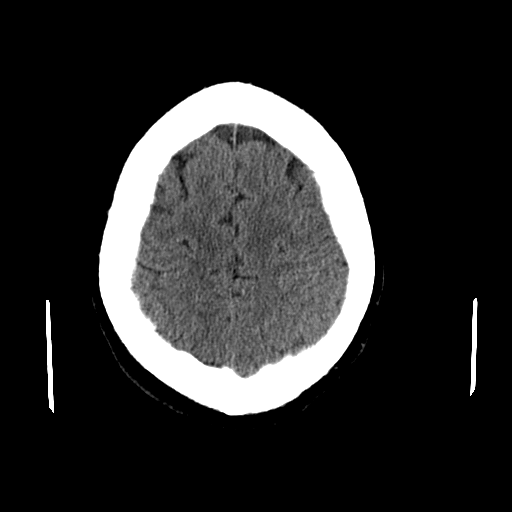
[im 26/31  brain]
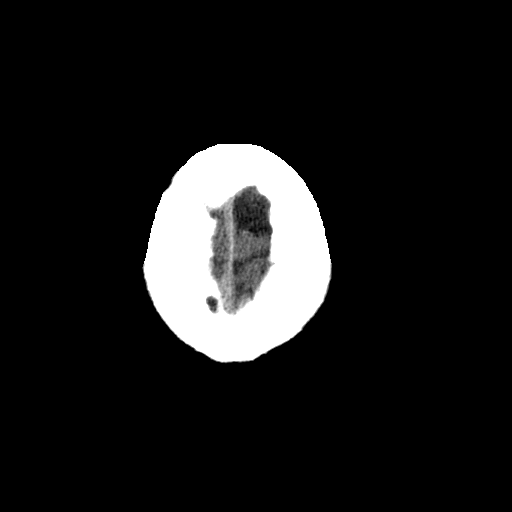
[im 26/31  bone]
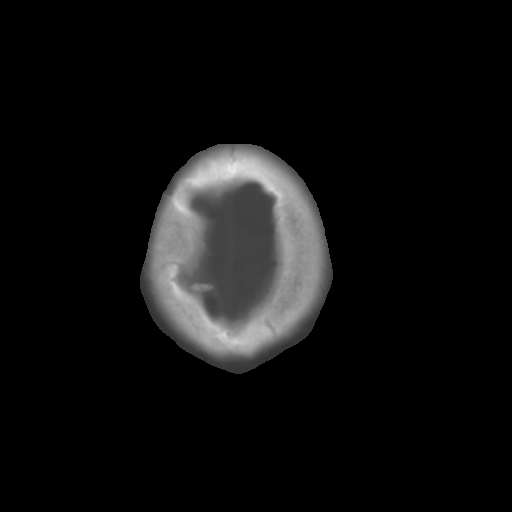
[im 28/31  brain]
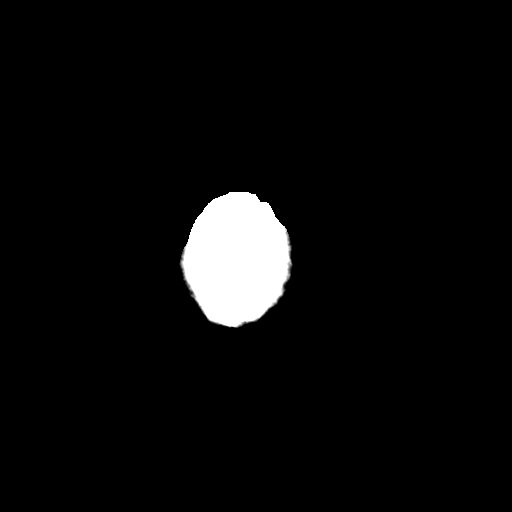

[Series 601: coronal brain · coronal · 0.46mm/px · 3 of 73 slices shown]
[im 25/73  brain]
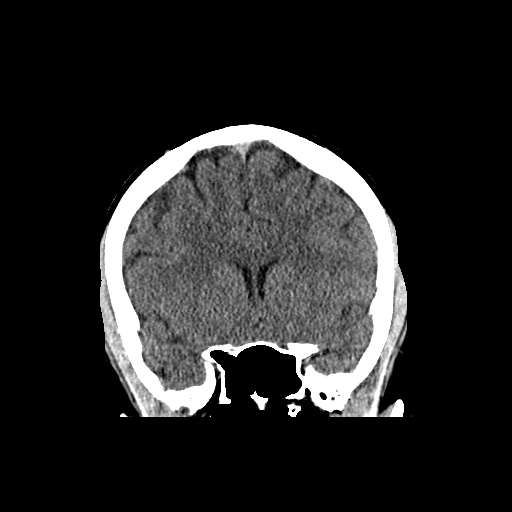
[im 33/73  brain]
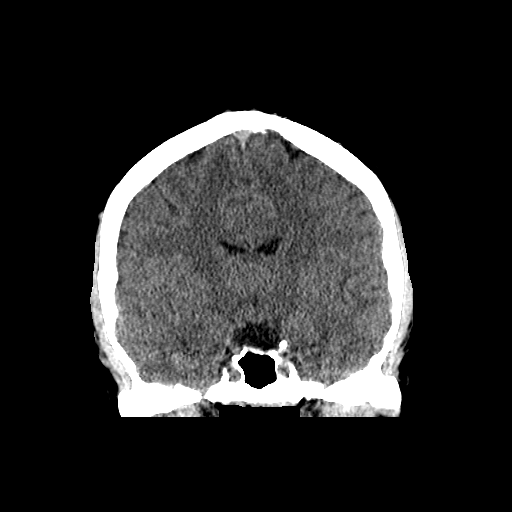
[im 41/73  brain]
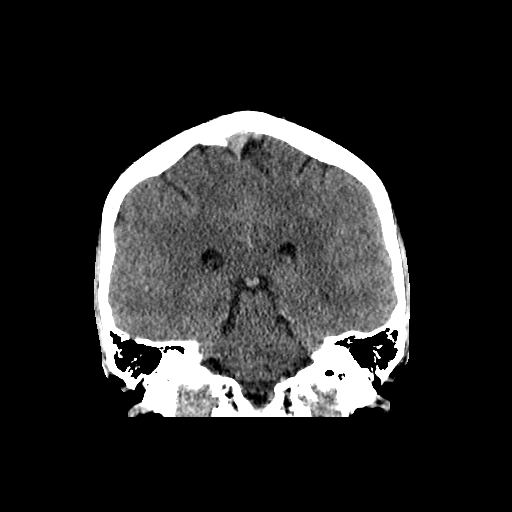

[Series 602: sagittal brain · sagittal · 0.46mm/px · 3 of 60 slices shown]
[im 20/60  brain]
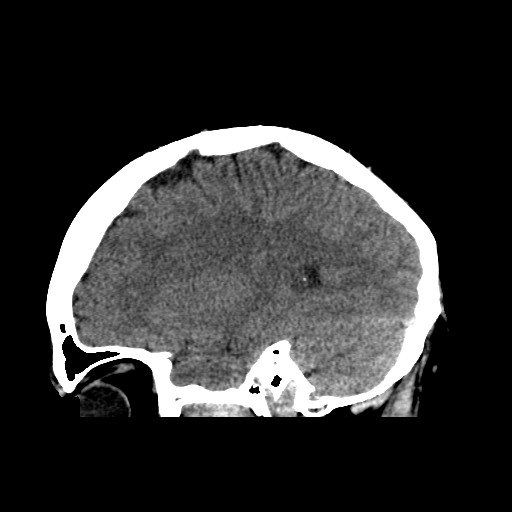
[im 30/60  brain]
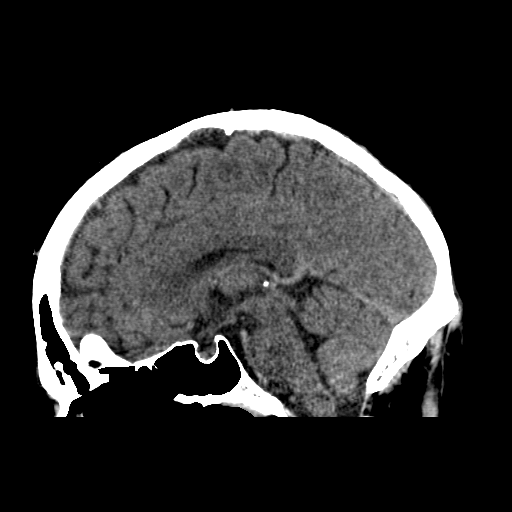
[im 40/60  brain]
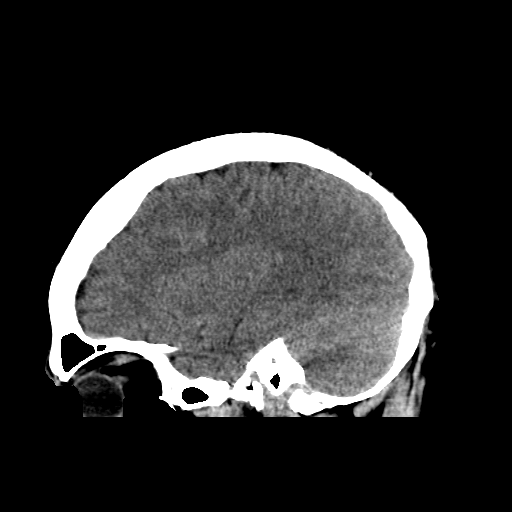

[16 of 47 positions shown; findings below may reference images not displayed]

FINDINGS: Brain: No evidence of acute infarction, hemorrhage, hydrocephalus,
extra-axial collection or mass lesion/mass effect.

Vascular: No hyperdense vessel or unexpected calcification.

Skull: Normal. Negative for fracture or focal lesion.

Sinuses/Orbits: No acute finding.

Other: None.
IMPRESSION: 1. No acute intracranial abnormality or displaced calvarial
fracture.
2. Unremarkable CT of the head.

By: Charwilie Duuood M.D.

## 2018-09-13 ENCOUNTER — Other Ambulatory Visit: Payer: Self-pay | Admitting: Adult Health

## 2018-09-13 DIAGNOSIS — I10 Essential (primary) hypertension: Secondary | ICD-10-CM

## 2018-11-27 ENCOUNTER — Other Ambulatory Visit: Payer: Self-pay | Admitting: Adult Health

## 2018-11-27 DIAGNOSIS — J3089 Other allergic rhinitis: Secondary | ICD-10-CM

## 2019-01-04 NOTE — Progress Notes (Signed)
Complete Physical  Assessment and Plan: Health Maintenance- Discussed STD testing, safe sex, alcohol and drug awareness, drinking and driving dangers, wearing a seat belt and general safety measures for young adult. Recommended wearing sunscreen daily for skin cancer risk reduction.   Encounter for routine adult health examination without abnormal findings  Essential hypertension Continue medication: olmesartan 40 mg daily  Monitor blood pressure at home; call if consistently over 130/80 Continue DASH diet.   Reminder to go to the ER if any CP, SOB, nausea, dizziness, severe HA, changes vision/speech, left arm numbness and tingling and jaw pain. -     CBC with Differential/Platelet -     COMPLETE METABOLIC PANEL WITH GFR -     Magnesium -     Urinalysis, Routine w reflex microscopic -     Microalbumin / creatinine urine ratio -     EKG 12-Lead  Non-seasonal allergic rhinitis due to other allergic trigger Continue OTC allergy pills, nasal sprays, avoid triggers  Gastroesophageal reflux disease, esophagitis presence not specified Well managed on current medications, wasn't well controlled on H2i Discussed diet, avoiding triggers and other lifestyle changes -     Magnesium  Vitamin D deficiency -     VITAMIN D 25 Hydroxy (Vit-D Deficiency, Fractures)  Obesity (BMI 30.0-34.9) Long discussion about weight loss, diet, and exercise Recommended diet heavy in fruits and veggies and low in animal meats, cheeses, and dairy products, appropriate calorie intake Patient will work on reducing intake when working less, cut back on sugar/flour, increase fruits/veggies (aim to eat berries, cruciferous veggies, greens, onions/garlic daily) Discussed appropriate weight for height and initial goal (<216lb) Follow up at next visit  Mixed hyperlipidemia Diet for trigs discussed at length Continue low cholesterol diet and exercise.  Check lipid panel.  -     Lipid panel -     TSH  Family history  of death due to heart problem at 55 years of age or younger Aggressive risk factor management Control blood pressure, cholesterol, glucose, increase exercise, maintain weight Will refer for screening at age 41   Screening for diabetes mellitus -     Hemoglobin A1c  Discussed med's effects and SE's. Screening labs and tests as requested with regular follow-up as recommended. 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 Patient reports no complaints at this time.   BMI is Body mass index is 30.79 kg/m., he was working on diet, was down to 216 lb a few months ago then regained over the holidays. He has been skipping french fries, getting okra or zuccini, choosing grilled proteins more. He has blueberry muffins for breakfast. Admits to overall poor fruit/veggie intake.  He works on a farm daily. He cut out sodas, but drinks 1 glass of sweet tea. He admits to no drinking much water, has a few gatorades.  Wt Readings from Last 3 Encounters:  01/05/19 227 lb (103 kg)  07/03/18 231  lb 12.8 oz (105.1 kg)  06/04/18 235 lb (106.6 kg)   he has a diagnosis of GERD which is currently managed by omeprazole 40 mg daily he reports symptoms is currently well controlled, and denies breakthrough reflux, burning in chest, hoarseness or cough.    His blood pressure has been controlled at home (120s over 65  or so), today their BP is BP: 130/88  He does workout. He denies chest pain, shortness of breath, dizziness.   He is on cholesterol medication and denies myalgias. His cholesterol is not at goal. The cholesterol last visit was:   Lab Results  Component Value Date   CHOL 188 07/03/2018   HDL 31 (L) 07/03/2018   LDLCALC  07/03/2018     Comment:     . LDL cholesterol not calculated. Triglyceride levels greater than 400 mg/dL invalidate calculated LDL results. . Reference range: <100 . Desirable range <100 mg/dL for primary prevention;   <70 mg/dL for patients with CHD or diabetic patients  with > or = 2 CHD risk factors. Marland Kitchen LDL-C is now calculated using the Martin-Hopkins  calculation, which is a validated novel method providing  better accuracy than the Friedewald equation in the  estimation of LDL-C.  Horald Pollen et al. Lenox Ahr. 1470;929(57): 2061-2068  (http://education.[facility].com/faq/FAQ164)    TRIG 404 (H) 07/03/2018   CHOLHDL 6.1 (H) 07/03/2018    He has been working on diet and exercise for glucose management. Last A1C in the office was:  Lab Results  Component Value Date   HGBA1C 4.9 03/18/2018   Patient is on Vitamin D supplement.   Lab Results  Component Value Date   VD25OH 72 03/18/2018       Current Medications:  Current Outpatient Medications on File Prior to Visit  Medication Sig Dispense Refill  . aspirin 81 MG chewable tablet Chew 81 mg daily by mouth.    Marland Kitchen azelastine (ASTELIN) 0.1 % nasal spray Use 1 to 2 Sprays each Nares 1 or 2 x /day 90 mL 3  . Cetirizine HCl (ZYRTEC ALLERGY PO) Take by mouth daily.    . Cholecalciferol 5000 units TABS Take 5,000 Units by mouth daily.     Marland Kitchen olmesartan (BENICAR) 40 MG tablet TAKE 1/2 TO 1 TABLET BY MOUTH DAILY FOR BLOOD PRESSURE GOAL <130/80 90 tablet 1  . omeprazole (PRILOSEC) 40 MG capsule TAKE 1 CAPSULE EVERY MORNING FOR ACID REFLUX 90 capsule 3  . Saccharomyces boulardii (FLORASTOR PO) Take by mouth.    .  Levocetirizine Dihydrochloride (XYZAL ALLERGY 24HR PO) Take by mouth.     No current facility-administered medications on file prior to visit.    Health Maintenance:    There is no immunization history on file for this patient.  TD/TDAP: 2017 Influenza: declines Pneumovax: n/a HPV vaccines: Had as a teen, 2/2  Sexually Active: yes STD testing offered, declines Last Dental Exam: goes q 6 months, last 2019 Last Eye Exam: Wears contacts, goes annually 2019  Allergies: Not on File Medical History:  has Hypertension; GERD (gastroesophageal reflux disease); Allergic rhinitis due to allergen; Vitamin D deficiency; Family history of death due to heart problem at 67 years of age or younger; Hyperlipidemia; and Obesity (BMI 30.0-34.9) on their problem list. Surgical History:  He  has a past surgical history that includes Wisdom tooth extraction (2014). Family History:  Hisfamily history includes Diabetes in his maternal grandmother; Heart attack (age of onset: 71) in his paternal uncle; Heart attack (age of onset: 42) in his  paternal grandfather; Heart failure (age of onset: 7480) in his paternal grandmother; Hypertension in his father; Hypothyroidism in his mother. Social History:   reports that he has never smoked. He has quit using smokeless tobacco. He reports that he does not drink alcohol or use drugs.  Review of Systems: Review of Systems  Constitutional: Negative for malaise/fatigue and weight loss.  HENT: Negative for hearing loss and tinnitus.   Eyes: Negative for blurred vision and double vision.  Respiratory: Negative for cough, sputum production, shortness of breath and wheezing.   Cardiovascular: Negative for chest pain, palpitations, orthopnea, claudication, leg swelling and PND.  Gastrointestinal: Negative for abdominal pain, blood in stool, constipation, diarrhea, heartburn, melena, nausea and vomiting.  Genitourinary: Negative.   Musculoskeletal: Negative for falls, joint  pain and myalgias.  Skin: Negative for rash.  Neurological: Negative for dizziness, tingling, sensory change, weakness and headaches.  Endo/Heme/Allergies: Negative for polydipsia.  Psychiatric/Behavioral: Negative.  Negative for depression, memory loss, substance abuse and suicidal ideas. The patient is not nervous/anxious and does not have insomnia.   All other systems reviewed and are negative.   Physical Exam: Estimated body mass index is 30.79 kg/m as calculated from the following:   Height as of this encounter: 6' (1.829 m).   Weight as of this encounter: 227 lb (103 kg). BP 130/88   Pulse 76   Temp 97.9 F (36.6 C)   Ht 6' (1.829 m)   Wt 227 lb (103 kg)   SpO2 98%   BMI 30.79 kg/m  General Appearance: Well nourished, in no apparent distress.  Eyes: PERRLA, EOMs, conjunctiva no swelling or erythema, normal fundi and vessels.  Sinuses: No Frontal/maxillary tenderness  ENT/Mouth: Ext aud canals clear, normal light reflex with TMs without erythema, bulging. Good dentition. No erythema, swelling, or exudate on post pharynx. Tonsils not swollen or erythematous. Hearing normal.  Neck: Supple, thyroid normal. No bruits  Respiratory: Respiratory effort normal, BS equal bilaterally without rales, rhonchi, wheezing or stridor.  Cardio: RRR without murmurs, rubs or gallops. Brisk peripheral pulses without edema.  Chest: symmetric, with normal excursions and percussion.  Abdomen: Soft, nontender, no guarding, rebound, hernias, masses, or organomegaly.  Lymphatics: Non tender without lymphadenopathy.  Genitourinary: defer, no issues Musculoskeletal: Full ROM all peripheral extremities,5/5 strength, and normal gait.  Skin: Warm, dry without rashes, lesions, ecchymosis. Neuro: Cranial nerves intact, reflexes equal bilaterally. Normal muscle tone, no cerebellar symptoms. Sensation intact.  Psych: Awake and oriented X 3, normal affect, Insight and Judgment appropriate.   EKG: WNL baseline  in chart, defer  Dan MakerAshley C Berlie Hatchel 8:58 AM [facility]

## 2019-01-05 ENCOUNTER — Encounter: Payer: Self-pay | Admitting: Adult Health

## 2019-01-05 ENCOUNTER — Ambulatory Visit (INDEPENDENT_AMBULATORY_CARE_PROVIDER_SITE_OTHER): Payer: BLUE CROSS/BLUE SHIELD | Admitting: Adult Health

## 2019-01-05 VITALS — BP 130/88 | HR 76 | Temp 97.9°F | Ht 72.0 in | Wt 227.0 lb

## 2019-01-05 DIAGNOSIS — Z1329 Encounter for screening for other suspected endocrine disorder: Secondary | ICD-10-CM

## 2019-01-05 DIAGNOSIS — Z1389 Encounter for screening for other disorder: Secondary | ICD-10-CM | POA: Diagnosis not present

## 2019-01-05 DIAGNOSIS — I10 Essential (primary) hypertension: Secondary | ICD-10-CM | POA: Diagnosis not present

## 2019-01-05 DIAGNOSIS — K219 Gastro-esophageal reflux disease without esophagitis: Secondary | ICD-10-CM

## 2019-01-05 DIAGNOSIS — Z13220 Encounter for screening for lipoid disorders: Secondary | ICD-10-CM | POA: Diagnosis not present

## 2019-01-05 DIAGNOSIS — J3089 Other allergic rhinitis: Secondary | ICD-10-CM

## 2019-01-05 DIAGNOSIS — Z79899 Other long term (current) drug therapy: Secondary | ICD-10-CM

## 2019-01-05 DIAGNOSIS — E559 Vitamin D deficiency, unspecified: Secondary | ICD-10-CM | POA: Diagnosis not present

## 2019-01-05 DIAGNOSIS — Z8241 Family history of sudden cardiac death: Secondary | ICD-10-CM

## 2019-01-05 DIAGNOSIS — E782 Mixed hyperlipidemia: Secondary | ICD-10-CM

## 2019-01-05 DIAGNOSIS — Z0000 Encounter for general adult medical examination without abnormal findings: Secondary | ICD-10-CM

## 2019-01-05 DIAGNOSIS — Z131 Encounter for screening for diabetes mellitus: Secondary | ICD-10-CM

## 2019-01-05 DIAGNOSIS — E669 Obesity, unspecified: Secondary | ICD-10-CM

## 2019-01-05 NOTE — Patient Instructions (Addendum)
Jaime Little , Thank you for taking time to come for your Annual Wellness Visit. I appreciate your ongoing commitment to your health goals. Please review the following plan we discussed and let me know if I can assist you in the future.   These are the goals we discussed: Goals    . DIET - EAT MORE FRUITS AND VEGETABLES     Try to eat fruit or veggies with each meal.     . DIET - INCREASE WATER INTAKE     Aim for 4 bottles minimum daily     . LDL CALC < 100    . Weight (lb) < 216 lb (98 kg)       This is a list of the screening recommended for you and due dates:  Health Maintenance  Topic Date Due  . Flu Shot  02/16/2019*  . HIV Screening  03/19/2019*  . Tetanus Vaccine  11/18/2025  *Topic was postponed. The date shown is not the original due date.    Avoid sugar and white flour products, highly processed foods - reserve for special occasions  Consider wearing sunscreen daily on high exposure areas (face, ears, neck, arms) - choose one with zinc oxide and titanium dioxide to minimize risk of skin cancer  Increase fruits and vegetables - aim to eat berries, cruciferous vegetables (broccoli, cauliflower, asparagus, cabbage, etc), greens and onions/garlic daily     Aim for 7+ servings of fruits and vegetables daily  65-80+ fluid ounces of water or unsweet tea for healthy kidneys  Limit to max 1 drink of alcohol per day; avoid smoking/tobacco  Limit animal fats in diet for cholesterol and heart health - choose grass fed whenever available  Avoid highly processed foods, and foods high in saturated/trans fats  Aim for low stress - take time to unwind and care for your mental health  Aim for 150 min of moderate intensity exercise weekly for heart health, and weights twice weekly for bone health  Aim for 7-9 hours of sleep daily      When it comes to diets, agreement about the perfect plan isn't easy to find, even among the experts. Experts at the [facility] [employer] developed an idea known as the Healthy Eating Plate. Just imagine a plate divided into logical, healthy portions.  The emphasis is on diet quality:  Load up on vegetables and fruits - one-half of your plate: Aim for color and variety, and remember that potatoes don't count.  Go for whole grains - one-quarter of your plate: Whole wheat, barley, wheat berries, quinoa, oats, Michelsen rice, and foods made with them. If you want pasta, go with whole wheat pasta.  Protein power - one-quarter of your plate: Fish, chicken, beans, and nuts are all healthy, versatile protein sources. Limit red meat.  The diet, however, does go beyond the plate, offering a few other suggestions.  Use healthy plant oils, such as olive, canola, soy, corn, sunflower and peanut. Check the labels, and avoid partially hydrogenated oil, which have unhealthy trans fats.  If you're thirsty, drink water. Coffee and tea are good in moderation, but skip sugary drinks and limit milk and dairy products to one or two daily servings.  The type of carbohydrate in the diet is more important than the amount. Some sources of carbohydrates, such as vegetables, fruits, whole grains, and beans-are healthier than others.  Finally, stay active.    High Triglycerides Eating Plan Triglycerides are a type of fat in  the blood. High levels of triglycerides can increase your risk of heart disease and stroke. If your triglyceride levels are high, choosing the right foods can help lower your triglycerides and keep your heart healthy. Work with your health care provider or a diet and nutrition specialist (dietitian) to develop an eating plan that is right for you. What are tips for following this plan? General guidelines   Lose weight, if you are overweight. For most people, losing 5-10 lbs (2-5 kg) helps lower triglyceride levels. A weight-loss plan may include. ? 30 minutes of exercise at least 5 days a week. ? Reducing the amount of  calories, sugar, and fat you eat.  Eat a wide variety of fresh fruits, vegetables, and whole grains. These foods are high in fiber.  Eat foods that contain healthy fats, such as fatty fish, nuts, seeds, and olive oil.  Avoid foods that are high in added sugar, added salt (sodium), saturated fat, and trans fat.  Avoid low-fiber, refined carbohydrates such as white bread, crackers, noodles, and white rice.  Avoid foods with partially hydrogenated oils (trans fats), such as fried foods or stick margarine.  Limit alcohol intake to no more than 1 drink a day for nonpregnant women and 2 drinks a day for men. One drink equals 12 oz of beer, 5 oz of wine, or 1 oz of hard liquor. Your health care provider may recommend that you drink less depending on your overall health. Reading food labels  Check food labels for the amount of saturated fat. Choose foods with no or very little saturated fat.  Check food labels for the amount of trans fat. Choose foods with no trans fat.  Check food labels for the amount of cholesterol. Choose foods low in cholesterol. Ask your dietitian how much cholesterol you should have each day.  Check food labels for the amount of sodium. Choose foods with less than 140 milligrams (mg) per serving. Shopping  Buy dairy products labeled as nonfat (skim) or low-fat (1%).  Avoid buying processed or prepackaged foods. These are often high in added sugar, sodium, and fat. Cooking  Choose healthy fats when cooking, such as olive oil or canola oil.  Cook foods using lower fat methods, such as baking, broiling, boiling, or grilling.  Make your own sauces, dressings, and marinades when possible, instead of buying them. Store-bought sauces, dressings, and marinades are often high in sodium and sugar. Meal planning  Eat more home-cooked food and less restaurant, buffet, and fast food.  Eat fatty fish at least 2 times each week. Examples of fatty fish include salmon, trout,  mackerel, tuna, and herring.  If you eat whole eggs, do not eat more than 3 egg yolks per week. What foods are recommended? The items listed may not be a complete list. Talk with your dietitian about what dietary choices are best for you. Grains Whole wheat or whole grain breads, crackers, cereals, and pasta. Unsweetened oatmeal. Bulgur. Barley. Quinoa. Mccole rice. Whole wheat flour tortillas. Vegetables Fresh or frozen vegetables. Low-sodium canned vegetables. Fruits All fresh, canned (in natural juice), or frozen fruits. Meats and other protein foods Skinless chicken or [place]. Ground chicken or [place]. Lean cuts of pork, trimmed of fat. Fish and seafood, especially salmon, trout, and herring. Egg whites. Dried beans, peas, or lentils. Unsalted nuts or seeds. Unsalted canned beans. Natural peanut or almond butter. Dairy Low-fat dairy products. Skim or low-fat (1%) milk. Reduced fat (2%) and low-sodium cheese. Low-fat ricotta cheese. Low-fat cottage cheese.  Plain, low-fat yogurt. Fats and oils Tub margarine without trans fats. Light or reduced-fat mayonnaise. Light or reduced-fat salad dressings. Avocado. Safflower, olive, sunflower, soybean, and canola oils. What foods are not recommended? The items listed may not be a complete list. Talk with your dietitian about what dietary choices are best for you. Grains White bread. White (regular) pasta. White rice. Cornbread. Bagels. Pastries. Crackers that contain trans fat. Vegetables Creamed or fried vegetables. Vegetables in a cheese sauce. Fruits Sweetened dried fruit. Canned fruit in syrup. Fruit juice. Meats and other protein foods Fatty cuts of meat. Ribs. Chicken wings. Tomasa BlaseBacon. Sausage. Bologna. Salami. Chitterlings. Fatback. Hot dogs. Bratwurst. Packaged lunch meats. Dairy Whole or reduced-fat (2%) milk. Half-and-half. Cream cheese. Full-fat or sweetened yogurt. Full-fat cheese. Nondairy creamers. Whipped toppings. Processed cheese or  cheese spreads. Cheese curds. Beverages Alcohol. Sweetened drinks, such as soda, lemonade, fruit drinks, or punches. Fats and oils Butter. Stick margarine. Lard. Shortening. Ghee. Bacon fat. Tropical oils, such as coconut, palm kernel, or palm oils. Sweets and desserts Corn syrup. Sugars. Honey. Molasses. Candy. Jam and jelly. Syrup. Sweetened cereals. Cookies. Pies. Cakes. Donuts. Muffins. Ice cream. Condiments Store-bought sauces, dressings, and marinades that are high in sugar, such as ketchup and barbecue sauce. Summary  High levels of triglycerides can increase the risk of heart disease and stroke. Choosing the right foods can help lower your triglycerides.  Eat plenty of fresh fruits, vegetables, and whole grains. Choose low-fat dairy and lean meats. Eat fatty fish at least twice a week.  Avoid processed and prepackaged foods with added sugar, sodium, saturated fat, and trans fat.  If you need suggestions or have questions about what types of food are good for you, talk with your health care provider or a dietitian. This information is not intended to replace advice given to you by your health care provider. Make sure you discuss any questions you have with your health care provider. Document Released: 08/22/2004 Document Revised: 01/07/2017 Document Reviewed: 01/07/2017 [employer]vier Interactive Patient Education  2019 [employer]

## 2019-01-06 LAB — LIPID PANEL
CHOL/HDL RATIO: 4.4 (calc) (ref <5.0)
CHOLESTEROL: 170 mg/dL (ref <200)
HDL: 39 mg/dL — AB (ref >=40)
LDL Cholesterol (Calc): 99 mg/dL (calc)
Non-HDL Cholesterol (Calc): 131 mg/dL (calc) — ABNORMAL HIGH (ref <130)
Triglycerides: 199 mg/dL — ABNORMAL HIGH (ref <150)

## 2019-01-06 LAB — COMPLETE METABOLIC PANEL WITH GFR
AG RATIO: 1.9 (calc) (ref 1.0–2.5)
ALKALINE PHOSPHATASE (APISO): 100 U/L (ref 36–130)
ALT: 25 U/L (ref 9–46)
AST: 22 U/L (ref 10–40)
Albumin: 4.7 g/dL (ref 3.6–5.1)
BUN: 9 mg/dL (ref 7–25)
CALCIUM: 9.6 mg/dL (ref 8.6–10.3)
CO2: 27 mmol/L (ref 20–32)
CREATININE: 0.9 mg/dL (ref 0.60–1.35)
Chloride: 104 mmol/L (ref 98–110)
GFR, EST NON AFRICAN AMERICAN: 118 mL/min/{1.73_m2} (ref >=60)
GFR, Est African American: 137 mL/min/{1.73_m2} (ref >=60)
GLUCOSE: 94 mg/dL (ref 65–99)
Globulin: 2.5 g/dL (calc) (ref 1.9–3.7)
POTASSIUM: 3.9 mmol/L (ref 3.5–5.3)
Sodium: 141 mmol/L (ref 135–146)
Total Bilirubin: 0.9 mg/dL (ref 0.2–1.2)
Total Protein: 7.2 g/dL (ref 6.1–8.1)

## 2019-01-06 LAB — CBC WITH DIFFERENTIAL/PLATELET
Absolute Monocytes: 431 cells/uL (ref 200–950)
BASOS ABS: 62 {cells}/uL (ref 0–200)
Basophils Relative: 1.1 %
Eosinophils Absolute: 73 cells/uL (ref 15–500)
Eosinophils Relative: 1.3 %
HEMATOCRIT: 45.6 % (ref 38.5–50.0)
Hemoglobin: 15.9 g/dL (ref 13.2–17.1)
LYMPHS ABS: 1590 {cells}/uL (ref 850–3900)
MCH: 29.1 pg (ref 27.0–33.0)
MCHC: 34.9 g/dL (ref 32.0–36.0)
MCV: 83.4 fL (ref 80.0–100.0)
MONOS PCT: 7.7 %
MPV: 10.9 fL (ref 7.5–12.5)
NEUTROS ABS: 3444 {cells}/uL (ref 1500–7800)
NEUTROS PCT: 61.5 %
PLATELETS: 237 10*3/uL (ref 140–400)
RBC: 5.47 10*6/uL (ref 4.20–5.80)
RDW: 13 % (ref 11.0–15.0)
Total Lymphocyte: 28.4 %
WBC: 5.6 10*3/uL (ref 3.8–10.8)

## 2019-01-06 LAB — MICROALBUMIN / CREATININE URINE RATIO
CREATININE, URINE: 197 mg/dL (ref 20–320)
Microalb Creat Ratio: 2 mcg/mg creat (ref <30)
Microalb, Ur: 0.3 mg/dL

## 2019-01-06 LAB — URINALYSIS, ROUTINE W REFLEX MICROSCOPIC
Bilirubin Urine: NEGATIVE
Glucose, UA: NEGATIVE
Hgb urine dipstick: NEGATIVE
Ketones, ur: NEGATIVE
Leukocytes,Ua: NEGATIVE
Nitrite: NEGATIVE
Protein, ur: NEGATIVE
Specific Gravity, Urine: 1.024 (ref 1.001–1.03)
pH: 5.5 (ref 5.0–8.0)

## 2019-01-06 LAB — TSH: TSH: 1.17 mIU/L (ref 0.40–4.50)

## 2019-01-06 LAB — HEMOGLOBIN A1C
Hgb A1c MFr Bld: 5 % of total Hgb (ref <5.7)
Mean Plasma Glucose: 97 (calc)
eAG (mmol/L): 5.4 (calc)

## 2019-01-06 LAB — MAGNESIUM: MAGNESIUM: 2 mg/dL (ref 1.5–2.5)

## 2019-01-06 LAB — VITAMIN D 25 HYDROXY (VIT D DEFICIENCY, FRACTURES): Vit D, 25-Hydroxy: 66 ng/mL (ref 30–100)

## 2019-03-09 ENCOUNTER — Other Ambulatory Visit: Payer: Self-pay | Admitting: Internal Medicine

## 2019-03-09 DIAGNOSIS — I10 Essential (primary) hypertension: Secondary | ICD-10-CM

## 2019-05-23 ENCOUNTER — Other Ambulatory Visit: Payer: Self-pay | Admitting: Internal Medicine

## 2019-05-23 DIAGNOSIS — I10 Essential (primary) hypertension: Secondary | ICD-10-CM

## 2019-05-23 DIAGNOSIS — K219 Gastro-esophageal reflux disease without esophagitis: Secondary | ICD-10-CM

## 2019-07-05 NOTE — Progress Notes (Signed)
3 Month Follow Up   Assessment and Plan:   Essential Hypertension Well controlled with current medications  Continue medications Olmesartan 40mg  daily Monitor blood pressure at home; patient to call if consistently greater than 140/90 Continue DASH diet.   Reminder to go to the ER if any CP, SOB, nausea, dizziness, severe HA, changes vision/speech, left arm numbness and tingling and jaw pain.  Non-season allergic rhinitis due to other allergic trigger Well controlled at this time  GERD Well manaaged at this time Continue H2 Discussed dietary changes  Vitamin D Deficiency Continue supplementation Will check Vitamin D  Mixed hyperlipademia Diet controlled Continue low cholesterol diet and exercise.  Defer lipid panel this check, stable last visit  Obesity with co morbidities (32) Long discussion about weight loss, diet, and exercise Recommended diet heavy in fruits and veggies and low in animal meats, cheeses, and dairy products, appropriate calorie intake Follow up in 6 months.  Constipation Increase water intake 60-80oz a day Increase fiber, vegetables and fruit in diet. Increase physical activity, walking Try Colace 100mg  daily OR Miralax powder 17g mix with 8oz of water daily OR Metamucil daily Please go to the hospital if you have severe abdominal pain, vomiting, fever, CP, SOB.    Continue diet and meds as discussed. Further disposition pending results of labs. Discussed med's effects and SE's.  Patient agrees with plan of care and opportunity to ask questions/voice concerns. Over 30 minutes of chart review, interview, exam, counseling, and critical decision making was performed.   Future Appointments  Date Time Provider Department Center  01/06/2020  9:00 AM Judd Gaudierorbett, Ashley, NP [facility] None    ----------------------------------------------------------------------------------------------------------------------  HPI 28 y.o. male  presents for 3 month  follow up on HTN, HLD,  history of pre-diabetes, weight and vitamin D deficiency. He battles with seasonal allergies and reports he has lost of nasal drainage and can hardly breath through his nose.  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  He has tried Astelin in the past, not currently using, and zyrtec and allegra.  He is not sure what to take to help clear this up.  He reports drainage down his throat by denies cough or sore throat.  Also denies any otalgia or popping cracking.  Denies any fever or headaches. He also reports that he is having difficulty with bowel movements.  He reports they are painful and has difficultly.  Denies any abdominal pains, cramping, nausea of vomiting.  Reports that he drink 4 16 oz bottles of water a day sometimes less.  Reports he is not thirsty despite doing physical labor outside.  He reports he eats multiple fried foods a day and does not eat many, if any vegetables.   BMI is Body mass index is 32.14 kg/m., he has been working on diet and exercise by mean of physical labor.  He does not do any purposeful exercise at this time. Wt Readings from Last 3 Encounters:  07/06/19 237 lb (107.5 kg)  01/05/19 227 lb (103 kg)  07/03/18 231 lb 12.8 oz (105.1 kg)    His blood pressure has been controlled at home.  He reports it ranges from  today their BP is BP: (!) 126/92.  Reports he took his blood pressure medication 30min prior to appointment.  He does  workout. He denies any cardiac symptoms, chest pains, palpitations, shortness of breath, dizziness or lower extremity edema.     He is not on cholesterol medication.  His cholesterol is at goal. The cholesterol last visit was:   Lab Results  Component Value Date   CHOL 170 01/05/2019   HDL 39 (L) 01/05/2019   LDLCALC 99 01/05/2019   TRIG 199 (H) 01/05/2019   CHOLHDL 4.4 01/05/2019    He has been working on diet and exercise for prediabetes, and denies polydipsia, polyuria, visual  disturbances, vomiting and weight loss. Last A1C in the office was:  Lab Results  Component Value Date   HGBA1C 5.0 01/05/2019   Patient is on Vitamin D supplement.   Lab Results  Component Value Date   VD25OH 66 01/05/2019       Current Medications:  Current Outpatient Medications on File Prior to Visit  Medication Sig  . aspirin 81 MG chewable tablet Chew 81 mg daily by mouth.  . Cetirizine HCl (ZYRTEC ALLERGY PO) Take by mouth as needed.   . Cholecalciferol 5000 units TABS Take 5,000 Units by mouth daily.   Marland Kitchen. olmesartan (BENICAR) 40 MG tablet Take 1 tablet Daily for BP  . omeprazole (PRILOSEC) 40 MG capsule Take 1 capsule Daily for Indigestion & Acid Reflux  . azelastine (ASTELIN) 0.1 % nasal spray Use 1 to 2 Sprays each Nares 1 or 2 x /day (Patient not taking: Reported on 07/06/2019)  . Saccharomyces boulardii (FLORASTOR PO) Take by mouth.   No current facility-administered medications on file prior to visit.     Allergies:  Not on File   Medical History:  History reviewed. No pertinent past medical history.  Family history- Reviewed and unchanged   Social history- Reviewed and unchanged   Names of Other Physician/Practitioners you currently use: 1. [facility] here for primary care 2. Eye Exam: Due for 2020 3. Dental Exam Due for 2020  Patient Care Team: Lucky CowboyMcKeown, William, MD as PCP - General (Internal Medicine)   Screening Tests:  There is no immunization history on file for this patient.   Vaccinations: TD or Tdap: Due  Influenza: Due for 2020   Review of Systems:  Review of Systems  Constitutional: Negative for chills, diaphoresis, fever, malaise/fatigue and weight loss.  HENT: Positive for sinus pain. Negative for congestion, ear discharge, ear pain, hearing loss, nosebleeds, sore throat and tinnitus.        Endorses sinus pressure  Eyes: Negative for blurred vision, double vision, photophobia, pain, discharge  and redness.  Respiratory: Negative for cough, hemoptysis, sputum production, shortness of breath, wheezing and stridor.   Cardiovascular: Negative for chest pain, palpitations, orthopnea, claudication, leg swelling and PND.  Gastrointestinal: Positive for constipation. Negative for abdominal pain, blood in stool, diarrhea, heartburn, melena, nausea and vomiting.  Genitourinary: Negative for dysuria, flank pain, frequency, hematuria and urgency.  Musculoskeletal: Negative for back pain, falls, joint pain, myalgias and neck pain.  Skin: Negative for itching and rash.  Neurological: Negative for dizziness, tingling, tremors, sensory change, speech change, focal weakness, seizures, loss of consciousness, weakness and headaches.  Endo/Heme/Allergies: Negative for environmental allergies and polydipsia. Does not bruise/bleed easily.  Psychiatric/Behavioral: Negative for depression, hallucinations, memory loss, substance abuse and suicidal ideas. The patient is not nervous/anxious and does not have insomnia.       Physical Exam: BP (!) 126/92   Pulse 75   Temp (!) 97.5 F (36.4 C)   Ht 6' (1.829 m)   Wt 237 lb (107.5 kg)   SpO2 97%   BMI 32.14 kg/m  Wt Readings from Last 3 Encounters:  07/06/19 237 lb (  107.5 kg)  01/05/19 227 lb (103 kg)  07/03/18 231 lb 12.8 oz (105.1 kg)   General Appearance: Well nourished, in no apparent distress. Eyes: PERRLA, EOMs, conjunctiva no swelling or erythema Sinuses: No Frontal/maxillary tenderness ENT/Mouth: Ext aud canals clear, TMs without erythema, bulging, serous noted. No erythema, swelling, or exudate on post pharynx.  Tonsils not swollen or erythematous. Hearing normal.  Neck: Supple, thyroid normal.  Respiratory: Respiratory effort normal, BS equal bilaterally without rales, rhonchi, wheezing or stridor.  Cardio: RRR with no MRGs. Brisk peripheral pulses without edema.  Abdomen: Soft, + BS.  Non tender, no guarding, rebound, hernias,  masses. Lymphatics: Non tender without lymphadenopathy.  Musculoskeletal: Full ROM, 5/5 strength, Normal gait Skin: Warm, dry without rashes, lesions, ecchymosis.  Neuro: Cranial nerves intact. No cerebellar symptoms.  Psych: Awake and oriented X 3, normal affect, Insight and Judgment appropriate.    Garnet Sierras, NP [facility] 10:26 AM

## 2019-07-06 ENCOUNTER — Ambulatory Visit: Payer: Self-pay | Admitting: Adult Health

## 2019-07-06 ENCOUNTER — Other Ambulatory Visit: Payer: Self-pay

## 2019-07-06 ENCOUNTER — Encounter: Payer: Self-pay | Admitting: Adult Health Nurse Practitioner

## 2019-07-06 ENCOUNTER — Ambulatory Visit (INDEPENDENT_AMBULATORY_CARE_PROVIDER_SITE_OTHER): Payer: BLUE CROSS/BLUE SHIELD | Admitting: Adult Health Nurse Practitioner

## 2019-07-06 VITALS — BP 126/92 | HR 75 | Temp 97.5°F | Ht 72.0 in | Wt 237.0 lb

## 2019-07-06 DIAGNOSIS — E559 Vitamin D deficiency, unspecified: Secondary | ICD-10-CM | POA: Diagnosis not present

## 2019-07-06 DIAGNOSIS — K219 Gastro-esophageal reflux disease without esophagitis: Secondary | ICD-10-CM

## 2019-07-06 DIAGNOSIS — E782 Mixed hyperlipidemia: Secondary | ICD-10-CM

## 2019-07-06 DIAGNOSIS — I10 Essential (primary) hypertension: Secondary | ICD-10-CM | POA: Diagnosis not present

## 2019-07-06 DIAGNOSIS — E669 Obesity, unspecified: Secondary | ICD-10-CM

## 2019-07-06 DIAGNOSIS — J3089 Other allergic rhinitis: Secondary | ICD-10-CM

## 2019-07-06 DIAGNOSIS — E66811 Obesity, class 1: Secondary | ICD-10-CM

## 2019-07-06 DIAGNOSIS — K5909 Other constipation: Secondary | ICD-10-CM

## 2019-07-06 MED ORDER — FLUTICASONE PROPIONATE 50 MCG/ACT NA SUSP: 1.0000 | Freq: Every day | NASAL | 3 refills | Status: DC

## 2019-07-06 NOTE — Patient Instructions (Addendum)
We will contact you via MyChart with your lab results in 1-3 days.  We have sent in a prescription for fluticasone(Flonase)  IF your insurance does not cover this by Flonase off the shelf.  Use this once a day AFTER you have used the NetiPot sinus rinse.   Allergy Symptoms / Runny Nose: Chose one and take every night  Zyrtec / Cetirizine Take 10mg  by mouth May cause drowsiness, take nightly Be sure to drink plenty of water If this is not effective, try Xyzal or Allegra  OR  Xyzal / Levocetirazine  Take 5mg  by mouth May cause drowsiness, take nightly Be sure to drink plenty of water If this is not effective try Allegra or Zyrtec  OR  Allegra / fexofenadine Take 180mg  by mouth daily If this is not effective try Zyrtec or Xyzal   *If you battle with chronic allergies you may need to change the antihistamine you currently use to find most effective.    Constipation  Increase your water intake at least 80oz of water a day Try Colace, one tablet daily, if it becomes too soft do every other day  Miralax or metamucil powder These help to bulk up your stool and make it softer.  Increase the amount of vegetables, not fried, in your diet.  The importance is finding that balance.  Below are recommended foods for Celiac or gluten sensitivity.  Try making some adjustments to see if this helps.    Recommended foods Grains  Amaranth, bean flours, 100% buckwheat flour, corn, millet, nut flours or nut meals, GF oats, quinoa, rice, sorghum, teff, rice wafers, pure cornmeal tortillas, popcorn, and hot cereals made from cornmeal. Hominy, rice, wild rice. Some Asian rice noodles or bean noodles. Arrowroot starch, corn bran, corn flour, corn germ, cornmeal, corn starch, potato flour, potato starch flour, and rice bran. Plain, Doody, and sweet rice flours. Rice polish, soy flour, and tapioca starch. Vegetables  All plain fresh, frozen, and canned vegetables. Fruits  All plain fresh,  frozen, canned, and dried fruits, and 100% fruit juices. Meats and other protein foods  All fresh beef, pork, poultry, fish, seafood, and eggs. Fish canned in water, oil, brine, or vegetable broth. Plain nuts and seeds, peanut butter. Some lunch meat and some frankfurters. Dried beans, dried peas, and lentils. Dairy  Fresh plain, dry, evaporated, or condensed milk. Cream, butter, sour cream, whipping cream, and most yogurts. Unprocessed cheese, most processed cheeses, some cottage cheese, some cream cheeses. Beverages  Coffee, tea, most herbal teas. Carbonated beverages and some root beers. Wine, sake, and distilled spirits, such as gin, vodka, and whiskey. Most hard ciders. Fats and oils  Butter, margarine, vegetable oil, hydrogenated butter, olive oil, shortening, lard, cream, and some mayonnaise. Some commercial salad dressings. Olives. Sweets and desserts  Sugar, honey, some syrups, molasses, jelly, and jam. Plain hard candy, marshmallows, and gumdrops. Pure cocoa powder. Plain chocolate. Custard and some pudding mixes. Gelatin desserts, sorbets, frozen ice pops, and sherbet. Cake, cookies, and other desserts prepared with allowed flours. Some commercial ice creams. Cornstarch, tapioca, and rice puddings. Seasoning and other foods  Some canned or frozen soups. Monosodium glutamate (MSG). Cider, rice, and wine vinegar. Baking soda and baking powder. Cream of tartar. Baking and nutritional yeast. Certain soy sauces made without wheat (ask your dietitian about specific brands that are allowed). Nuts, coconut, and chocolate. Salt, pepper, herbs, spices, flavoring extracts, imitation or artificial flavorings, natural flavorings, and food colorings. Some medicines and supplements. Some lip glosses  and other cosmetics. Rice syrups. The items listed may not be a complete list. Talk with your dietitian about what dietary choices are best for you.  Foods to avoid Grains  Barley, bran, bulgur,  couscous, cracked wheat, [place], farro, graham, malt, matzo, semolina, wheat germ, and all wheat and rye cereals including spelt and kamut. Cereals containing malt as a flavoring, such as rice cereal. Noodles, spaghetti, macaroni, most packaged rice mixes, and all mixes containing wheat, rye, barley, or triticale. Vegetables  Most creamed vegetables and most vegetables canned in sauces. Some commercially prepared vegetables and salads. Fruits  Thickened or prepared fruits and some pie fillings. Some fruit snacks and fruit roll-ups. Meats and other protein foods  Any meat or meat alternative containing wheat, rye, barley, or gluten stabilizers. These are often marinated or packaged meats and lunch meats. Bread-containing products, such as Swiss steak, croquettes, meatballs, and meatloaf. Most tuna canned in vegetable broth and [place] with hydrolyzed vegetable protein (HVP) injected as part of the basting. Seitan. Imitation fish. Eggs in sauces made from ingredients to avoid. Dairy  Commercial chocolate milk drinks and malted milk. Some non-dairy creamers. Any cheese product containing ingredients to avoid. Beverages  Certain cereal beverages. Beer, ale, malted milk, and some root beers. Some hard ciders. Some instant flavored coffees. Some herbal teas made with barley or with barley malt added. Fats and oils  Some commercial salad dressings. Sour cream containing modified food starch. Sweets and desserts  Some toffees. Chocolate-coated nuts (may be rolled in wheat flour) and some commercial candies and candy bars. Most cakes, cookies, donuts, pastries, and other baked goods. Some commercial ice cream. Ice cream cones. Commercially prepared mixes for cakes, cookies, and other desserts. Bread pudding and other puddings thickened with flour. Products containing [place] rice syrup made with barley malt enzyme. Desserts and sweets made with malt flavoring. Seasoning and other foods  Some curry  powders, some dry seasoning mixes, some gravy extracts, some meat sauces, some ketchups, some prepared mustards, and horseradish. Certain soy sauces. Malt vinegar. Bouillon and bouillon cubes that contain HVP. Some chip dips, and some chewing gum. Yeast extract. Brewer's yeast. Caramel color. Some medicines and supplements. Some lip glosses and other cosmetics. The items listed may not be a complete list. Talk with your dietitian about what dietary choices are best for you. Summary  Gluten is a protein that is found in wheat, rye, barley, and some other grains. The gluten-free diet includes all foods that do not contain gluten.  If you need help finding gluten-free foods or if you have questions, talk with your diet and nutrition specialist (registered dietitian) or your health care provider.  Read all food labels. Gluten is often added to foods. Always check the ingredient list and look for warnings, such as "may contain gluten." This information is not intended to replace advice given to you by your health care provider. Make sure you discuss any questions you have with your health care provider.

## 2019-07-07 LAB — COMPLETE METABOLIC PANEL WITH GFR
AG Ratio: 1.6 (calc) (ref 1.0–2.5)
ALT: 45 U/L (ref 9–46)
AST: 29 U/L (ref 10–40)
Albumin: 4.5 g/dL (ref 3.6–5.1)
Alkaline phosphatase (APISO): 72 U/L (ref 36–130)
BUN: 9 mg/dL (ref 7–25)
CO2: 28 mmol/L (ref 20–32)
Calcium: 10 mg/dL (ref 8.6–10.3)
Chloride: 105 mmol/L (ref 98–110)
Creat: 0.85 mg/dL (ref 0.60–1.35)
GFR, Est African American: 139 mL/min/{1.73_m2} (ref >=60)
GFR, Est Non African American: 120 mL/min/{1.73_m2} (ref >=60)
Globulin: 2.9 g/dL (calc) (ref 1.9–3.7)
Glucose, Bld: 99 mg/dL (ref 65–99)
Potassium: 4.4 mmol/L (ref 3.5–5.3)
Sodium: 141 mmol/L (ref 135–146)
Total Bilirubin: 0.6 mg/dL (ref 0.2–1.2)
Total Protein: 7.4 g/dL (ref 6.1–8.1)

## 2019-07-07 LAB — CBC WITH DIFFERENTIAL/PLATELET
Absolute Monocytes: 546 cells/uL (ref 200–950)
Basophils Absolute: 48 cells/uL (ref 0–200)
Basophils Relative: 0.9 %
Eosinophils Absolute: 148 cells/uL (ref 15–500)
Eosinophils Relative: 2.8 %
HCT: 46.1 % (ref 38.5–50.0)
Hemoglobin: 15.7 g/dL (ref 13.2–17.1)
Lymphs Abs: 1622 cells/uL (ref 850–3900)
MCH: 29 pg (ref 27.0–33.0)
MCHC: 34.1 g/dL (ref 32.0–36.0)
MCV: 85.1 fL (ref 80.0–100.0)
MPV: 11.3 fL (ref 7.5–12.5)
Monocytes Relative: 10.3 %
Neutro Abs: 2936 cells/uL (ref 1500–7800)
Neutrophils Relative %: 55.4 %
Platelets: 239 10*3/uL (ref 140–400)
RBC: 5.42 10*6/uL (ref 4.20–5.80)
RDW: 13.1 % (ref 11.0–15.0)
Total Lymphocyte: 30.6 %
WBC: 5.3 10*3/uL (ref 3.8–10.8)

## 2019-08-17 ENCOUNTER — Other Ambulatory Visit: Payer: Self-pay | Admitting: Internal Medicine

## 2019-08-17 DIAGNOSIS — K219 Gastro-esophageal reflux disease without esophagitis: Secondary | ICD-10-CM

## 2019-08-17 DIAGNOSIS — I10 Essential (primary) hypertension: Secondary | ICD-10-CM

## 2019-11-13 ENCOUNTER — Other Ambulatory Visit: Payer: Self-pay | Admitting: Adult Health Nurse Practitioner

## 2019-11-13 DIAGNOSIS — K219 Gastro-esophageal reflux disease without esophagitis: Secondary | ICD-10-CM

## 2019-11-15 ENCOUNTER — Other Ambulatory Visit: Payer: Self-pay | Admitting: Adult Health Nurse Practitioner

## 2019-11-15 DIAGNOSIS — I10 Essential (primary) hypertension: Secondary | ICD-10-CM

## 2020-01-05 NOTE — Progress Notes (Deleted)
Complete Physical  Assessment and Plan: Health Maintenance- Discussed STD testing, safe sex, alcohol and drug awareness, drinking and driving dangers, wearing a seat belt and general safety measures for young adult. Recommended wearing sunscreen daily for skin cancer risk reduction.   Encounter for routine adult health examination without abnormal findings  Essential hypertension Continue medication: olmesartan 40 mg daily  Monitor blood pressure at home; call if consistently over 130/80 Continue DASH diet.   Reminder to go to the ER if any CP, SOB, nausea, dizziness, severe HA, changes vision/speech, left arm numbness and tingling and jaw pain. -     CBC with Differential/Platelet -     COMPLETE METABOLIC PANEL WITH GFR -     Magnesium -     Urinalysis, Routine w reflex microscopic -     Microalbumin / creatinine urine ratio -     EKG 12-Lead  Non-seasonal allergic rhinitis due to other allergic trigger Continue OTC allergy pills, nasal sprays, avoid triggers  Gastroesophageal reflux disease, esophagitis presence not specified Well managed on current medications, wasn't well controlled on H2i Discussed diet, avoiding triggers and other lifestyle changes -     Magnesium  Vitamin D deficiency -     VITAMIN D 25 Hydroxy (Vit-D Deficiency, Fractures)  Obesity (BMI 30.0-34.9) Long discussion about weight loss, diet, and exercise Recommended diet heavy in fruits and veggies and low in animal meats, cheeses, and dairy products, appropriate calorie intake Patient will work on reducing intake when working less, cut back on sugar/flour, increase fruits/veggies (aim to eat berries, cruciferous veggies, greens, onions/garlic daily) Discussed appropriate weight for height and initial goal (<215 ***lb) Follow up at next visit  Mixed hyperlipidemia Diet for trigs discussed at length Continue low cholesterol diet and exercise.  Check lipid panel.  -     Lipid panel -     TSH  Family  history of death due to heart problem at 19 years of age or younger Aggressive risk factor management Control blood pressure, cholesterol, glucose, increase exercise, maintain weight Will refer for screening at age 4    Discussed med's effects and SE's. Screening labs and tests as requested with regular follow-up as recommended. Over 40 minutes of exam, counseling, chart review and critical decision making was performed  Future Appointments  Date Time Provider Department Center  01/06/2020  9:00 AM Judd Gaudier, NP [facility] None  01/05/2021  9:30 AM Judd Gaudier, NP [facility] None    HPI  This very nice 28 y.o.male presents for complete physical.has Hypertension; GERD (gastroesophageal reflux disease); Allergic rhinitis due to allergen; Vitamin D deficiency; Family history of death due to heart problem at 61 years of age or younger; Hyperlipidemia; and Obesity (BMI 30.0-34.9) on their problem list.   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   BMI is There is no height or weight on file to calculate BMI., he was working on diet, *** He has been skipping french fries, getting okra or zuccini, choosing grilled proteins more. He has blueberry muffins for breakfast. Admits to overall poor fruit/veggie intake.  He works on a farm daily. He cut out sodas, but drinks 1 glass of sweet tea. He admits to no drinking much water, has a few gatorades.  Wt Readings from Last 3 Encounters:  07/06/19 237 lb (107.5 kg)  01/05/19 227 lb (103 kg)  07/03/18 231 lb 12.8 oz (105.1 kg)   he has a diagnosis  of GERD which is currently managed by omeprazole 40 mg daily he reports symptoms is currently well controlled, and denies breakthrough reflux, burning in chest, hoarseness or cough.    His blood pressure has been controlled at home (120s over 65 or so), today their BP is    He does workout. He denies chest pain, shortness of breath,  dizziness.   He is not on cholesterol medication and denies myalgias. His cholesterol is not at goal. The cholesterol last visit was:   Lab Results  Component Value Date   CHOL 170 01/05/2019   HDL 39 (L) 01/05/2019   LDLCALC 99 01/05/2019   TRIG 199 (H) 01/05/2019   CHOLHDL 4.4 01/05/2019    He has been working on diet and exercise for glucose management. Last A1C in the office was:  Lab Results  Component Value Date   HGBA1C 5.0 01/05/2019   Patient is on Vitamin D supplement.   Lab Results  Component Value Date   VD25OH 66 01/05/2019       Current Medications:  Current Outpatient Medications on File Prior to Visit  Medication Sig Dispense Refill  . aspirin 81 MG chewable tablet Chew 81 mg daily by mouth.    Marland Kitchen azelastine (ASTELIN) 0.1 % nasal spray Use 1 to 2 Sprays each Nares 1 or 2 x /day (Patient not taking: Reported on 07/06/2019) 90 mL 3  . Cetirizine HCl (ZYRTEC ALLERGY PO) Take by mouth as needed.     . Cholecalciferol 5000 units TABS Take 5,000 Units by mouth daily.     . fluticasone (FLONASE) 50 MCG/ACT nasal spray Place 1 spray into both nostrils daily. 1 g 3  . olmesartan (BENICAR) 40 MG tablet Take 1 tablet Daily for BP 90 tablet 0  . omeprazole (PRILOSEC) 40 MG capsule Take 1 capsule Daily for Indigestion & Acid Reflux 90 capsule 1  . Saccharomyces boulardii (FLORASTOR PO) Take by mouth.     No current facility-administered medications on file prior to visit.   Health Maintenance:    There is no immunization history on file for this patient.  TD/TDAP: 2017 Influenza: declines Pneumovax: n/a HPV vaccines: Had as a teen, 2/2  Sexually Active: yes STD testing offered, declines Last Dental Exam: goes q 6 months, last 2019 Last Eye Exam: Wears contacts, goes annually 2019  Allergies: Not on File Medical History:  has Hypertension; GERD (gastroesophageal reflux disease); Allergic rhinitis due to allergen; Vitamin D deficiency; Family history of death due to  heart problem at 39 years of age or younger; Hyperlipidemia; and Obesity (BMI 30.0-34.9) on their problem list. Surgical History:  He  has a past surgical history that includes Wisdom tooth extraction (2014). Family History:  Hisfamily history includes Diabetes in his maternal grandmother; Heart attack (age of onset: 66) in his paternal uncle; Heart attack (age of onset: 57) in his paternal grandfather; Heart failure (age of onset: 67) in his paternal grandmother; Hypertension in his father; Hypothyroidism in his mother. Social History:   reports that he has never smoked. He has quit using smokeless tobacco.  His smokeless tobacco use included chew. He reports that he does not drink alcohol or use drugs.  Review of Systems: Review of Systems  Constitutional: Negative for malaise/fatigue and weight loss.  HENT: Negative for hearing loss and tinnitus.   Eyes: Negative for blurred vision and double vision.  Respiratory: Negative for cough, sputum production, shortness of breath and wheezing.   Cardiovascular: Negative for chest pain, palpitations, orthopnea,  claudication, leg swelling and PND.  Gastrointestinal: Negative for abdominal pain, blood in stool, constipation, diarrhea, heartburn, melena, nausea and vomiting.  Genitourinary: Negative.   Musculoskeletal: Negative for falls, joint pain and myalgias.  Skin: Negative for rash.  Neurological: Negative for dizziness, tingling, sensory change, weakness and headaches.  Endo/Heme/Allergies: Negative for polydipsia.  Psychiatric/Behavioral: Negative.  Negative for depression, memory loss, substance abuse and suicidal ideas. The patient is not nervous/anxious and does not have insomnia.   All other systems reviewed and are negative.   Physical Exam: Estimated body mass index is 32.14 kg/m as calculated from the following:   Height as of 07/06/19: 6' (1.829 m).   Weight as of 07/06/19: 237 lb (107.5 kg). There were no vitals taken for this  visit. General Appearance: Well nourished, in no apparent distress.  Eyes: PERRLA, EOMs, conjunctiva no swelling or erythema, normal fundi and vessels.  Sinuses: No Frontal/maxillary tenderness  ENT/Mouth: Ext aud canals clear, normal light reflex with TMs without erythema, bulging. Good dentition. No erythema, swelling, or exudate on post pharynx. Tonsils not swollen or erythematous. Hearing normal.  Neck: Supple, thyroid normal. No bruits  Respiratory: Respiratory effort normal, BS equal bilaterally without rales, rhonchi, wheezing or stridor.  Cardio: RRR without murmurs, rubs or gallops. Brisk peripheral pulses without edema.  Chest: symmetric, with normal excursions and percussion.  Abdomen: Soft, nontender, no guarding, rebound, hernias, masses, or organomegaly.  Lymphatics: Non tender without lymphadenopathy.  Genitourinary: defer, no issues Musculoskeletal: Full ROM all peripheral extremities,5/5 strength, and normal gait.  Skin: Warm, dry without rashes, lesions, ecchymosis. Neuro: Cranial nerves intact, reflexes equal bilaterally. Normal muscle tone, no cerebellar symptoms. Sensation intact.  Psych: Awake and oriented X 3, normal affect, Insight and Judgment appropriate.   EKG: ***  Gorden Harms Nazirah Tri 7:54 AM [facility]

## 2020-01-06 ENCOUNTER — Encounter: Payer: Self-pay | Admitting: Adult Health

## 2020-01-12 NOTE — Progress Notes (Signed)
Complete Physical  Assessment and Plan: Health Maintenance- Discussed STD testing, safe sex, alcohol and drug awareness, drinking and driving dangers, wearing a seat belt and general safety measures for young adult. Recommended wearing sunscreen daily for skin cancer risk reduction.   Encounter for routine adult health examination without abnormal findings  Essential hypertension Continue medication: olmesartan 40 mg daily  Monitor blood pressure at home; call if consistently over 130/80 Continue DASH diet.   Reminder to go to the ER if any CP, SOB, nausea, dizziness, severe HA, changes vision/speech, left arm numbness and tingling and jaw pain. -     CBC with Differential/Platelet -     COMPLETE METABOLIC PANEL WITH GFR -     Magnesium -     Urinalysis, Routine w reflex microscopic -     Microalbumin / creatinine urine ratio -     EKG 12-Lead  Non-seasonal allergic rhinitis due to other allergic trigger Continue OTC allergy pills, nasal sprays, avoid triggers  Gastroesophageal reflux disease, esophagitis presence not specified Well managed on current medications, wasn't well controlled on H2i Discussed diet, avoiding triggers and other lifestyle changes -     Magnesium  Vitamin D deficiency -     VITAMIN D 25 Hydroxy (Vit-D Deficiency, Fractures)  Obesity (BMI 30.0-34.9) Long discussion about weight loss, diet, and exercise Recommended diet heavy in fruits and veggies and low in animal meats, cheeses, and dairy products, appropriate calorie intake Patient will work on reducing intake when working less, cut back on sugar/flour, increase fruits/veggies (aim to eat berries, cruciferous veggies, greens, onions/garlic daily) Discussed appropriate weight for height and initial goal (<215 lb) Follow up at next visit  Mixed hyperlipidemia Diet for trigs discussed at length Continue low cholesterol diet and exercise.  Check lipid panel.  -     Lipid panel -     TSH  Family  history of death due to heart problem at 42 years of age or younger Aggressive risk factor management Control blood pressure, cholesterol, glucose, increase exercise, maintain weight Will refer for screening at age 87    Discussed med's effects and SE's. Screening labs and tests as requested with regular follow-up as recommended. Over 40 minutes of exam, counseling, chart review and critical decision making was performed  Future Appointments  Date Time Provider Department Center  01/05/2021  9:30 AM Judd Gaudier, NP [facility] None    HPI  This very nice 28 y.o.male presents for complete physical.has Hypertension; GERD (gastroesophageal reflux disease); Allergic rhinitis due to allergen; Vitamin D deficiency; Family history of death due to heart problem at 16 years of age or younger; Hyperlipidemia; and Obesity (BMI 30.0-34.9) on their problem list.   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   Patient reports no complaints at this time.   BMI is Body mass index is 31.4 kg/m., he was working on diet,  He has been skipping french fries, getting okra or zuccini, choosing grilled proteins more. He has blueberry muffins for breakfast. Admits to overall poor fruit/veggie intake.  He works on a farm daily. He cut out sodas, but drinks 1 glass of sweet tea. He admits to no drinking much water, has a few gatorades.  Wt Readings from Last 3 Encounters:  01/13/20 238 lb (108 kg)  07/06/19 237 lb (107.5 kg)  01/05/19 227 lb (103 kg)   he has a diagnosis of GERD which is currently managed by omeprazole 40  mg daily he reports symptoms is currently well controlled, and denies breakthrough reflux, burning in chest, hoarseness or cough.    His blood pressure has been controlled at home (115- 120s over 65-70s), today their BP is BP: 122/88  He does workout. He denies chest pain, shortness of breath, dizziness.   He is not on cholesterol medication,  taking a cholesteol health OTC supplement. His LDL cholesterol is at goal. The cholesterol last visit was:   Lab Results  Component Value Date   CHOL 170 01/05/2019   HDL 39 (L) 01/05/2019   LDLCALC 99 01/05/2019   TRIG 199 (H) 01/05/2019   CHOLHDL 4.4 01/05/2019    He has been working on diet and exercise for glucose management. Last A1C in the office was:  Lab Results  Component Value Date   HGBA1C 5.0 01/05/2019   Lab Results  Component Value Date   GFRNONAA 120 07/06/2019   Patient is on Vitamin D supplement.   Lab Results  Component Value Date   VD25OH 66 01/05/2019       Current Medications:  Current Outpatient Medications on File Prior to Visit  Medication Sig Dispense Refill  . aspirin 81 MG chewable tablet Chew 81 mg daily by mouth.    . Cholecalciferol 5000 units TABS Take 5,000 Units by mouth daily.     Marland Kitchen olmesartan (BENICAR) 40 MG tablet Take 1 tablet Daily for BP 90 tablet 0  . omeprazole (PRILOSEC) 40 MG capsule Take 1 capsule Daily for Indigestion & Acid Reflux 90 capsule 1  . azelastine (ASTELIN) 0.1 % nasal spray Use 1 to 2 Sprays each Nares 1 or 2 x /day (Patient not taking: Reported on 07/06/2019) 90 mL 3  . Cetirizine HCl (ZYRTEC ALLERGY PO) Take by mouth as needed.     . fluticasone (FLONASE) 50 MCG/ACT nasal spray Place 1 spray into both nostrils daily. (Patient not taking: Reported on 01/13/2020) 1 g 3   No current facility-administered medications on file prior to visit.   Health Maintenance:    There is no immunization history on file for this patient.  TD/TDAP: 2017 Influenza: declines Pneumovax: n/a HPV vaccines: Had as a teen, 2/2  Sexually Active: yes STD testing offered, declines Last Dental Exam: goes q 6 months, last 2020 Last Eye Exam: Wears contacts, goes annually 2019, will schedule this year  Allergies: Not on File Medical History:  has Hypertension; GERD (gastroesophageal reflux disease); Allergic rhinitis due to allergen;  Vitamin D deficiency; Family history of death due to heart problem at 30 years of age or younger; Hyperlipidemia; and Obesity (BMI 30.0-34.9) on their problem list. Surgical History:  He  has a past surgical history that includes Wisdom tooth extraction (2014). Family History:  Hisfamily history includes Diabetes in his maternal grandmother; Heart attack (age of onset: 59) in his paternal uncle; Heart attack (age of onset: 65) in his paternal grandfather; Heart failure (age of onset: 35) in his paternal grandmother; Hypertension in his father; Hypothyroidism in his mother. Social History:   reports that he has never smoked. His smokeless tobacco use includes chew. He reports that he does not drink alcohol or use drugs.  Review of Systems: Review of Systems  Constitutional: Negative for malaise/fatigue and weight loss.  HENT: Negative for hearing loss and tinnitus.   Eyes: Negative for blurred vision and double vision.  Respiratory: Negative for cough, sputum production, shortness of breath and wheezing.   Cardiovascular: Negative for chest pain, palpitations, orthopnea, claudication, leg  swelling and PND.  Gastrointestinal: Negative for abdominal pain, blood in stool, constipation, diarrhea, heartburn, melena, nausea and vomiting.  Genitourinary: Negative.   Musculoskeletal: Negative for falls, joint pain and myalgias.  Skin: Negative for rash.  Neurological: Negative for dizziness, tingling, sensory change, weakness and headaches.  Endo/Heme/Allergies: Negative for polydipsia.  Psychiatric/Behavioral: Negative.  Negative for depression, memory loss, substance abuse and suicidal ideas. The patient is not nervous/anxious and does not have insomnia.   All other systems reviewed and are negative.   Physical Exam: Estimated body mass index is 31.4 kg/m as calculated from the following:   Height as of this encounter: 6\' 1"  (1.854 m).   Weight as of this encounter: 238 lb (108 kg). BP 122/88    Pulse 85   Temp (!) 97.5 F (36.4 C)   Ht 6\' 1"  (1.854 m)   Wt 238 lb (108 kg)   SpO2 97%   BMI 31.40 kg/m  General Appearance: Well nourished, in no apparent distress.  Eyes: PERRLA, EOMs, conjunctiva no swelling or erythema, normal fundi and vessels.  Sinuses: No Frontal/maxillary tenderness  ENT/Mouth: Ext aud canals clear, normal light reflex with TMs without erythema, bulging. Good dentition. No erythema, swelling, or exudate on post pharynx. Tonsils not swollen or erythematous. Hearing normal.  Neck: Supple, thyroid normal. No bruits  Respiratory: Respiratory effort normal, BS equal bilaterally without rales, rhonchi, wheezing or stridor.  Cardio: RRR without murmurs, rubs or gallops. Brisk peripheral pulses without edema.  Chest: symmetric, with normal excursions and percussion.  Abdomen: Soft, nontender, no guarding, rebound, hernias, masses, or organomegaly.  Lymphatics: Non tender without lymphadenopathy.  Genitourinary: defer, no issues Musculoskeletal: Full ROM all peripheral extremities,5/5 strength, and normal gait.  Skin: Warm, dry without rashes, lesions, ecchymosis. Neuro: Cranial nerves intact, reflexes equal bilaterally. Normal muscle tone, no cerebellar symptoms. Sensation intact.  Psych: Awake and oriented X 3, normal affect, Insight and Judgment appropriate.   EKG:  NSR  Andreina Outten 9:29 AM [facility]

## 2020-01-13 ENCOUNTER — Other Ambulatory Visit: Payer: Self-pay

## 2020-01-13 ENCOUNTER — Ambulatory Visit (INDEPENDENT_AMBULATORY_CARE_PROVIDER_SITE_OTHER): Payer: 59 | Admitting: Adult Health

## 2020-01-13 ENCOUNTER — Encounter: Payer: Self-pay | Admitting: Adult Health

## 2020-01-13 VITALS — BP 122/88 | HR 85 | Temp 97.5°F | Ht 73.0 in | Wt 238.0 lb

## 2020-01-13 DIAGNOSIS — E559 Vitamin D deficiency, unspecified: Secondary | ICD-10-CM | POA: Diagnosis not present

## 2020-01-13 DIAGNOSIS — Z1329 Encounter for screening for other suspected endocrine disorder: Secondary | ICD-10-CM | POA: Diagnosis not present

## 2020-01-13 DIAGNOSIS — E669 Obesity, unspecified: Secondary | ICD-10-CM

## 2020-01-13 DIAGNOSIS — Z13220 Encounter for screening for lipoid disorders: Secondary | ICD-10-CM | POA: Diagnosis not present

## 2020-01-13 DIAGNOSIS — I10 Essential (primary) hypertension: Secondary | ICD-10-CM

## 2020-01-13 DIAGNOSIS — Z0000 Encounter for general adult medical examination without abnormal findings: Secondary | ICD-10-CM

## 2020-01-13 DIAGNOSIS — Z8241 Family history of sudden cardiac death: Secondary | ICD-10-CM | POA: Diagnosis not present

## 2020-01-13 DIAGNOSIS — Z79899 Other long term (current) drug therapy: Secondary | ICD-10-CM

## 2020-01-13 DIAGNOSIS — Z8379 Family history of other diseases of the digestive system: Secondary | ICD-10-CM

## 2020-01-13 DIAGNOSIS — E782 Mixed hyperlipidemia: Secondary | ICD-10-CM

## 2020-01-13 DIAGNOSIS — Z136 Encounter for screening for cardiovascular disorders: Secondary | ICD-10-CM | POA: Diagnosis not present

## 2020-01-13 DIAGNOSIS — Z1389 Encounter for screening for other disorder: Secondary | ICD-10-CM | POA: Diagnosis not present

## 2020-01-13 DIAGNOSIS — K219 Gastro-esophageal reflux disease without esophagitis: Secondary | ICD-10-CM

## 2020-01-13 DIAGNOSIS — R197 Diarrhea, unspecified: Secondary | ICD-10-CM

## 2020-01-13 DIAGNOSIS — J3089 Other allergic rhinitis: Secondary | ICD-10-CM

## 2020-01-13 NOTE — Patient Instructions (Addendum)
  [name] , Thank you for taking time to come for your Annual Wellness Visit. I appreciate your ongoing commitment to your health goals. Please review the following plan we discussed and let me know if I can assist you in the future.   These are the goals we discussed: Goals    . DIET - REDUCE SUGAR INTAKE     Recommend cutting down on sugar intake  - the American Heart Association recommends no more than 9 teaspoons (38 g) of added sugar for men daily, and 6 teaspoons (25 g) for women. Added sugar can be in many things that you might not expect - salad dressings, bread that is not home made, "all natural" fruit juice, etc., most processed foods contain hidden sugars. Consider looking at labels and being aware of how much sugar you are consuming in a day. Less is always better for sugar; sugar reduces your body's immune response, and damages blood vessels, leading to increased risk of many diseases.      Marland Kitchen LDL CALC < 100    . Weight (lb) < 215 lb (97.5 kg)       This is a list of the screening recommended for you and due dates:  Health Maintenance  Topic Date Due  . Flu Shot  02/16/2020*  . Tetanus Vaccine  11/18/2025  . HIV Screening  Discontinued  *Topic was postponed. The date shown is not the original due date.      Know what a healthy weight is for you (roughly BMI <25) and aim to maintain this  Aim for 7+ servings of fruits and vegetables daily  65-80+ fluid ounces of water or unsweet tea for healthy kidneys  Limit to max 1 drink of alcohol per day; avoid smoking/tobacco  Limit animal fats in diet for cholesterol and heart health - choose grass fed whenever available  Avoid highly processed foods, and foods high in saturated/trans fats  Aim for low stress - take time to unwind and care for your mental health  Aim for 150 min of moderate intensity exercise weekly for heart health, and weights twice weekly for bone health  Aim for 7-9 hours of sleep daily

## 2020-01-14 LAB — CBC WITH DIFFERENTIAL/PLATELET
Absolute Monocytes: 618 cells/uL (ref 200–950)
Basophils Absolute: 60 cells/uL (ref 0–200)
Basophils Relative: 1 %
Eosinophils Absolute: 42 cells/uL (ref 15–500)
Eosinophils Relative: 0.7 %
HCT: 46 % (ref 38.5–50.0)
Hemoglobin: 15.8 g/dL (ref 13.2–17.1)
Lymphs Abs: 1560 cells/uL (ref 850–3900)
MCH: 29.4 pg (ref 27.0–33.0)
MCHC: 34.3 g/dL (ref 32.0–36.0)
MCV: 85.5 fL (ref 80.0–100.0)
MPV: 10.8 fL (ref 7.5–12.5)
Monocytes Relative: 10.3 %
Neutro Abs: 3720 cells/uL (ref 1500–7800)
Neutrophils Relative %: 62 %
Platelets: 226 10*3/uL (ref 140–400)
RBC: 5.38 10*6/uL (ref 4.20–5.80)
RDW: 12.9 % (ref 11.0–15.0)
Total Lymphocyte: 26 %
WBC: 6 10*3/uL (ref 3.8–10.8)

## 2020-01-14 LAB — COMPLETE METABOLIC PANEL WITH GFR
AG Ratio: 1.6 (calc) (ref 1.0–2.5)
ALT: 49 U/L — ABNORMAL HIGH (ref 9–46)
AST: 33 U/L (ref 10–40)
Albumin: 4.6 g/dL (ref 3.6–5.1)
Alkaline phosphatase (APISO): 75 U/L (ref 36–130)
BUN: 11 mg/dL (ref 7–25)
CO2: 30 mmol/L (ref 20–32)
Calcium: 10.2 mg/dL (ref 8.6–10.3)
Chloride: 105 mmol/L (ref 98–110)
Creat: 0.82 mg/dL (ref 0.60–1.35)
GFR, Est African American: 141 mL/min/{1.73_m2} (ref >=60)
GFR, Est Non African American: 122 mL/min/{1.73_m2} (ref >=60)
Globulin: 2.8 g/dL (calc) (ref 1.9–3.7)
Glucose, Bld: 94 mg/dL (ref 65–99)
Potassium: 4.5 mmol/L (ref 3.5–5.3)
Sodium: 141 mmol/L (ref 135–146)
Total Bilirubin: 0.6 mg/dL (ref 0.2–1.2)
Total Protein: 7.4 g/dL (ref 6.1–8.1)

## 2020-01-14 LAB — MICROALBUMIN / CREATININE URINE RATIO
Creatinine, Urine: 167 mg/dL (ref 20–320)
Microalb Creat Ratio: 2 mcg/mg creat (ref <30)
Microalb, Ur: 0.4 mg/dL

## 2020-01-14 LAB — URINALYSIS, ROUTINE W REFLEX MICROSCOPIC
Bilirubin Urine: NEGATIVE
Glucose, UA: NEGATIVE
Hgb urine dipstick: NEGATIVE
Ketones, ur: NEGATIVE
Leukocytes,Ua: NEGATIVE
Nitrite: NEGATIVE
Protein, ur: NEGATIVE
Specific Gravity, Urine: 1.023 (ref 1.001–1.03)
pH: 6 (ref 5.0–8.0)

## 2020-01-14 LAB — LIPID PANEL
Cholesterol: 175 mg/dL (ref <200)
HDL: 42 mg/dL (ref >=40)
LDL Cholesterol (Calc): 104 mg/dL (calc) — ABNORMAL HIGH
Non-HDL Cholesterol (Calc): 133 mg/dL (calc) — ABNORMAL HIGH (ref <130)
Total CHOL/HDL Ratio: 4.2 (calc) (ref <5.0)
Triglycerides: 170 mg/dL — ABNORMAL HIGH (ref <150)

## 2020-01-14 LAB — TSH: TSH: 1.79 mIU/L (ref 0.40–4.50)

## 2020-01-14 LAB — MAGNESIUM: Magnesium: 2.1 mg/dL (ref 1.5–2.5)

## 2020-01-14 LAB — CELIAC DISEASE COMPREHENSIVE PANEL WITH REFLEXES
(tTG) Ab, IgA: 1 U/mL
Immunoglobulin A: 195 mg/dL (ref 47–310)

## 2020-01-14 LAB — VITAMIN D 25 HYDROXY (VIT D DEFICIENCY, FRACTURES): Vit D, 25-Hydroxy: 64 ng/mL (ref 30–100)

## 2020-02-08 ENCOUNTER — Other Ambulatory Visit: Payer: Self-pay | Admitting: Internal Medicine

## 2020-02-08 DIAGNOSIS — I10 Essential (primary) hypertension: Secondary | ICD-10-CM

## 2020-07-13 NOTE — Progress Notes (Signed)
FOLLOW UP  Assessment and Plan:   Hypertension Fairly controlled with current medications; consider adding HCTZ if diastolic persistently elevated in office  Monitor blood pressure at home; patient to call if consistently greater than 130/80 Continue DASH diet.   Reminder to go to the ER if any CP, SOB, nausea, dizziness, severe HA, changes vision/speech, left arm numbness and tingling and jaw pain.  Cholesterol Mild elevations fairly controlled by lifestyle  Continue low cholesterol diet and exercise.  Check lipid panel.   Obesity with co morbidities Commended progress with weight loss- Long discussion about weight loss, diet, and exercise Recommended diet heavy in fruits and veggies and low in animal meats, cheeses, and dairy products, appropriate calorie intake Discussed ideal weight for height  Patient will work on watching portions, cholesterol restricted diet, limiting sugar intake Will follow up in 3 months  Vitamin D Def At goal at last visit; continue supplementation to maintain goal of 60-100 Defer Vit D level  Continue diet and meds as discussed. Further disposition pending results of labs. Discussed med's effects and SE's.   Over 30 minutes of exam, counseling, chart review, and critical decision making was performed.   Future Appointments  Date Time Provider Department Center  01/18/2021  9:00 AM Judd Gaudier, NP [facility] None    ----------------------------------------------------------------------------------------------------------------------  HPI 29 y.o. male  presents for 6 month follow up on hypertension, cholesterol, weight and vitamin D deficiency.   BMI is Body mass index is 30.87 kg/m., he has been working on diet and exercise, working more during the summer.  He is doing 1 soda per day  Wt Readings from Last 3 Encounters:  07/17/20 234 lb (106.1 kg)  01/13/20 238 lb (108 kg)  07/06/19 237 lb (107.5 kg)   His blood pressure has been  controlled at home (120/70s), today their BP is BP: 124/90  He does workout. He denies chest pain, shortness of breath, dizziness.   He is not on cholesterol medication and denies myalgias. His cholesterol is not at goal. The cholesterol last visit was:   Lab Results  Component Value Date   CHOL 175 01/13/2020   HDL 42 01/13/2020   LDLCALC 104 (H) 01/13/2020   TRIG 170 (H) 01/13/2020   CHOLHDL 4.2 01/13/2020   Last M3N in the office was:  Lab Results  Component Value Date   HGBA1C 5.0 01/05/2019   Patient is on Vitamin D supplement.   Lab Results  Component Value Date   VD25OH 64 01/13/2020       Current Medications:  Current Outpatient Medications on File Prior to Visit  Medication Sig  . aspirin 81 MG chewable tablet Chew 81 mg daily by mouth.  Marland Kitchen azelastine (ASTELIN) 0.1 % nasal spray Use 1 to 2 Sprays each Nares 1 or 2 x /day (Patient not taking: Reported on 07/06/2019)  . Cetirizine HCl (ZYRTEC ALLERGY PO) Take by mouth as needed.   . Cholecalciferol 5000 units TABS Take 5,000 Units by mouth daily.   . fluticasone (FLONASE) 50 MCG/ACT nasal spray Place 1 spray into both nostrils daily. (Patient not taking: Reported on 01/13/2020)  . olmesartan (BENICAR) 40 MG tablet TAKE 1 TABLET DAILY FOR BLOOD PRESSURE  . omeprazole (PRILOSEC) 40 MG capsule Take 1 capsule Daily for Indigestion & Acid Reflux   No current facility-administered medications on file prior to visit.     Allergies: Not on File   Medical History: No past medical history on file. Family history- Reviewed and unchanged Social  history- Reviewed and unchanged   Review of Systems:  Review of Systems  Constitutional: Negative for malaise/fatigue and weight loss.  HENT: Negative for hearing loss and tinnitus.   Eyes: Negative for blurred vision and double vision.  Respiratory: Negative for cough, shortness of breath and wheezing.   Cardiovascular: Negative for chest pain, palpitations, orthopnea, claudication  and leg swelling.  Gastrointestinal: Negative for abdominal pain, blood in stool, constipation, diarrhea, heartburn, melena, nausea and vomiting.  Genitourinary: Negative.   Musculoskeletal: Negative for joint pain and myalgias.  Skin: Negative for rash.  Neurological: Negative for dizziness, tingling, sensory change, weakness and headaches.  Endo/Heme/Allergies: Negative for polydipsia.  Psychiatric/Behavioral: Negative.   All other systems reviewed and are negative.    Physical Exam: BP 124/90   Pulse 70   Temp (!) 97.3 F (36.3 C)   Wt 234 lb (106.1 kg)   SpO2 99%   BMI 30.87 kg/m  Wt Readings from Last 3 Encounters:  07/17/20 234 lb (106.1 kg)  01/13/20 238 lb (108 kg)  07/06/19 237 lb (107.5 kg)   General Appearance: Well nourished, in no apparent distress. Eyes: PERRLA, EOMs, conjunctiva no swelling or erythema Sinuses: No Frontal/maxillary tenderness ENT/Mouth: Ext aud canals clear, TMs without erythema, bulging. No erythema, swelling, or exudate on post pharynx.  Tonsils not swollen or erythematous. Hearing normal.  Neck: Supple, thyroid normal.  Respiratory: Respiratory effort normal, BS equal bilaterally without rales, rhonchi, wheezing or stridor.  Cardio: RRR with no MRGs. Brisk peripheral pulses without edema.  Abdomen: Soft, + BS.  Non tender, no guarding, rebound, hernias, masses. Lymphatics: Non tender without lymphadenopathy.  Musculoskeletal: Full ROM, 5/5 strength, Normal gait Skin: Warm, dry without rashes, lesions, ecchymosis.  Neuro: Cranial nerves intact. No cerebellar symptoms.  Psych: Awake and oriented X 3, normal affect, Insight and Judgment appropriate.    Dan Maker, NP 8:48 AM [facility]

## 2020-07-17 ENCOUNTER — Other Ambulatory Visit: Payer: Self-pay

## 2020-07-17 ENCOUNTER — Ambulatory Visit (INDEPENDENT_AMBULATORY_CARE_PROVIDER_SITE_OTHER): Payer: 59 | Admitting: Adult Health

## 2020-07-17 ENCOUNTER — Encounter: Payer: Self-pay | Admitting: Adult Health

## 2020-07-17 VITALS — BP 124/90 | HR 70 | Temp 97.3°F | Wt 234.0 lb

## 2020-07-17 DIAGNOSIS — I10 Essential (primary) hypertension: Secondary | ICD-10-CM | POA: Diagnosis not present

## 2020-07-17 DIAGNOSIS — Z79899 Other long term (current) drug therapy: Secondary | ICD-10-CM

## 2020-07-17 DIAGNOSIS — E782 Mixed hyperlipidemia: Secondary | ICD-10-CM

## 2020-07-17 DIAGNOSIS — K219 Gastro-esophageal reflux disease without esophagitis: Secondary | ICD-10-CM | POA: Diagnosis not present

## 2020-07-17 DIAGNOSIS — E669 Obesity, unspecified: Secondary | ICD-10-CM

## 2020-07-17 DIAGNOSIS — E559 Vitamin D deficiency, unspecified: Secondary | ICD-10-CM | POA: Diagnosis not present

## 2020-07-17 DIAGNOSIS — E66811 Obesity, class 1: Secondary | ICD-10-CM

## 2020-07-17 NOTE — Patient Instructions (Signed)
Goals    . Blood Pressure < 130/80    . DIET - REDUCE SUGAR INTAKE     Recommend cutting down on sugar intake  - the American Heart Association recommends no more than 9 teaspoons (38 g) of added sugar for men daily, and 6 teaspoons (25 g) for women. Added sugar can be in many things that you might not expect - salad dressings, bread that is not home made, "all natural" fruit juice, etc., most processed foods contain hidden sugars. Consider looking at labels and being aware of how much sugar you are consuming in a day. Less is always better for sugar; sugar reduces your body's immune response, and damages blood vessels, leading to increased risk of many diseases.      Marland Kitchen LDL CALC < 100    . Weight (lb) < 215 lb (97.5 kg)         HYPERTENSION INFORMATION  Monitor your blood pressure at home, please keep a record and bring that in with you to your next office visit.   Go to the ER if any CP, SOB, nausea, dizziness, severe HA, changes vision/speech  Testing/Procedures: HOW TO TAKE YOUR BLOOD PRESSURE:  Rest 5 minutes before taking your blood pressure.  Don't smoke or drink caffeinated beverages for at least 30 minutes before.  Take your blood pressure before (not after) you eat.  Sit comfortably with your back supported and both feet on the floor (don't cross your legs).  Elevate your arm to heart level on a table or a desk.  Use the proper sized cuff. It should fit smoothly and snugly around your bare upper arm. There should be enough room to slip a fingertip under the cuff. The bottom edge of the cuff should be 1 inch above the crease of the elbow.  Your most recent BP: BP: 124/90   Take your medications faithfully as instructed. Maintain a healthy weight. Get at least 150 minutes of aerobic exercise per week. Minimize salt intake. Minimize alcohol intake  DASH Eating Plan DASH stands for "Dietary Approaches to Stop Hypertension." The DASH eating plan is a healthy eating  plan that has been shown to reduce high blood pressure (hypertension). Additional health benefits may include reducing the risk of type 2 diabetes mellitus, heart disease, and stroke. The DASH eating plan may also help with weight loss. WHAT DO I NEED TO KNOW ABOUT THE DASH EATING PLAN? For the DASH eating plan, you will follow these general guidelines:  Choose foods with a percent daily value for sodium of less than 5% (as listed on the food label).  Use salt-free seasonings or herbs instead of table salt or sea salt.  Check with your health care provider or pharmacist before using salt substitutes.  Eat lower-sodium products, often labeled as "lower sodium" or "no salt added."  Eat fresh foods.  Eat more vegetables, fruits, and low-fat dairy products.  Choose whole grains. Look for the word "whole" as the first word in the ingredient list.  Choose fish and skinless chicken or [place] more often than red meat. Limit fish, poultry, and meat to 6 oz (170 g) each day.  Limit sweets, desserts, sugars, and sugary drinks.  Choose heart-healthy fats.  Limit cheese to 1 oz (28 g) per day.  Eat more home-cooked food and less restaurant, buffet, and fast food.  Limit fried foods.  Cook foods using methods other than frying.  Limit canned vegetables. If you do use them, rinse them well to  decrease the sodium.  When eating at a restaurant, ask that your food be prepared with less salt, or no salt if possible. WHAT FOODS CAN I EAT? Seek help from a dietitian for individual calorie needs. Grains Whole grain or whole wheat bread. Wiltsie rice. Whole grain or whole wheat pasta. Quinoa, bulgur, and whole grain cereals. Low-sodium cereals. Corn or whole wheat flour tortillas. Whole grain cornbread. Whole grain crackers. Low-sodium crackers. Vegetables Fresh or frozen vegetables (raw, steamed, roasted, or grilled). Low-sodium or reduced-sodium tomato and vegetable juices. Low-sodium or  reduced-sodium tomato sauce and paste. Low-sodium or reduced-sodium canned vegetables.  Fruits All fresh, canned (in natural juice), or frozen fruits. Meat and Other Protein Products Ground beef (85% or leaner), grass-fed beef, or beef trimmed of fat. Skinless chicken or [place]. Ground chicken or [place]. Pork trimmed of fat. All fish and seafood. Eggs. Dried beans, peas, or lentils. Unsalted nuts and seeds. Unsalted canned beans. Dairy Low-fat dairy products, such as skim or 1% milk, 2% or reduced-fat cheeses, low-fat ricotta or cottage cheese, or plain low-fat yogurt. Low-sodium or reduced-sodium cheeses. Fats and Oils Tub margarines without trans fats. Light or reduced-fat mayonnaise and salad dressings (reduced sodium). Avocado. Safflower, olive, or canola oils. Natural peanut or almond butter. Other Unsalted popcorn and pretzels. The items listed above may not be a complete list of recommended foods or beverages. Contact your dietitian for more options. WHAT FOODS ARE NOT RECOMMENDED? Grains White bread. White pasta. White rice. Refined cornbread. Bagels and croissants. Crackers that contain trans fat. Vegetables Creamed or fried vegetables. Vegetables in a cheese sauce. Regular canned vegetables. Regular canned tomato sauce and paste. Regular tomato and vegetable juices. Fruits Dried fruits. Canned fruit in light or heavy syrup. Fruit juice. Meat and Other Protein Products Fatty cuts of meat. Ribs, chicken wings, bacon, sausage, bologna, salami, chitterlings, fatback, hot dogs, bratwurst, and packaged luncheon meats. Salted nuts and seeds. Canned beans with salt. Dairy Whole or 2% milk, cream, half-and-half, and cream cheese. Whole-fat or sweetened yogurt. Full-fat cheeses or blue cheese. Nondairy creamers and whipped toppings. Processed cheese, cheese spreads, or cheese curds. Condiments Onion and garlic salt, seasoned salt, table salt, and sea salt. Canned and packaged gravies.  Worcestershire sauce. Tartar sauce. Barbecue sauce. Teriyaki sauce. Soy sauce, including reduced sodium. Steak sauce. Fish sauce. Oyster sauce. Cocktail sauce. Horseradish. Ketchup and mustard. Meat flavorings and tenderizers. Bouillon cubes. Hot sauce. Tabasco sauce. Marinades. Taco seasonings. Relishes. Fats and Oils Butter, stick margarine, lard, shortening, ghee, and bacon fat. Coconut, palm kernel, or palm oils. Regular salad dressings. Other Pickles and olives. Salted popcorn and pretzels. The items listed above may not be a complete list of foods and beverages to avoid. Contact your dietitian for more information. WHERE CAN I FIND MORE INFORMATION? National Heart, Lung, and Blood Institute: [employer] Document Released: 10/24/2011 Document Revised: 03/21/2014 Document Reviewed: 09/08/2013 [facility] Patient Information 2015 [place], [place]. This information is not intended to replace advice given to you by your health care provider. Make sure you discuss any questions you have with your health care provider.

## 2020-07-18 LAB — COMPLETE METABOLIC PANEL WITH GFR
AG Ratio: 1.8 (calc) (ref 1.0–2.5)
ALT: 42 U/L (ref 9–46)
AST: 25 U/L (ref 10–40)
Albumin: 4.6 g/dL (ref 3.6–5.1)
Alkaline phosphatase (APISO): 71 U/L (ref 36–130)
BUN: 11 mg/dL (ref 7–25)
CO2: 30 mmol/L (ref 20–32)
Calcium: 9.4 mg/dL (ref 8.6–10.3)
Chloride: 104 mmol/L (ref 98–110)
Creat: 0.81 mg/dL (ref 0.60–1.35)
GFR, Est African American: 141 mL/min/{1.73_m2} (ref >=60)
GFR, Est Non African American: 122 mL/min/{1.73_m2} (ref >=60)
Globulin: 2.5 g/dL (calc) (ref 1.9–3.7)
Glucose, Bld: 94 mg/dL (ref 65–99)
Potassium: 4 mmol/L (ref 3.5–5.3)
Sodium: 141 mmol/L (ref 135–146)
Total Bilirubin: 0.7 mg/dL (ref 0.2–1.2)
Total Protein: 7.1 g/dL (ref 6.1–8.1)

## 2020-07-18 LAB — CBC WITH DIFFERENTIAL/PLATELET
Absolute Monocytes: 552 cells/uL (ref 200–950)
Basophils Absolute: 48 cells/uL (ref 0–200)
Basophils Relative: 1 %
Eosinophils Absolute: 182 cells/uL (ref 15–500)
Eosinophils Relative: 3.8 %
HCT: 45.2 % (ref 38.5–50.0)
Hemoglobin: 15.4 g/dL (ref 13.2–17.1)
Lymphs Abs: 1661 cells/uL (ref 850–3900)
MCH: 29.2 pg (ref 27.0–33.0)
MCHC: 34.1 g/dL (ref 32.0–36.0)
MCV: 85.6 fL (ref 80.0–100.0)
MPV: 10.8 fL (ref 7.5–12.5)
Monocytes Relative: 11.5 %
Neutro Abs: 2357 cells/uL (ref 1500–7800)
Neutrophils Relative %: 49.1 %
Platelets: 208 10*3/uL (ref 140–400)
RBC: 5.28 10*6/uL (ref 4.20–5.80)
RDW: 13.1 % (ref 11.0–15.0)
Total Lymphocyte: 34.6 %
WBC: 4.8 10*3/uL (ref 3.8–10.8)

## 2020-07-18 LAB — TSH: TSH: 3.01 mIU/L (ref 0.40–4.50)

## 2020-07-18 LAB — LIPID PANEL
Cholesterol: 169 mg/dL (ref <200)
HDL: 34 mg/dL — ABNORMAL LOW (ref >=40)
LDL Cholesterol (Calc): 103 mg/dL (calc) — ABNORMAL HIGH
Non-HDL Cholesterol (Calc): 135 mg/dL (calc) — ABNORMAL HIGH (ref <130)
Total CHOL/HDL Ratio: 5 (calc) — ABNORMAL HIGH (ref <5.0)
Triglycerides: 198 mg/dL — ABNORMAL HIGH (ref <150)

## 2020-07-18 LAB — MAGNESIUM: Magnesium: 2.1 mg/dL (ref 1.5–2.5)

## 2020-08-16 ENCOUNTER — Other Ambulatory Visit: Payer: Self-pay | Admitting: Internal Medicine

## 2020-08-16 DIAGNOSIS — I10 Essential (primary) hypertension: Secondary | ICD-10-CM

## 2020-08-16 DIAGNOSIS — K219 Gastro-esophageal reflux disease without esophagitis: Secondary | ICD-10-CM

## 2020-11-13 ENCOUNTER — Other Ambulatory Visit: Payer: Self-pay | Admitting: Internal Medicine

## 2020-11-13 DIAGNOSIS — K219 Gastro-esophageal reflux disease without esophagitis: Secondary | ICD-10-CM

## 2020-12-19 ENCOUNTER — Other Ambulatory Visit: Payer: Self-pay

## 2020-12-19 ENCOUNTER — Ambulatory Visit (INDEPENDENT_AMBULATORY_CARE_PROVIDER_SITE_OTHER): Payer: 59 | Admitting: Adult Health Nurse Practitioner

## 2020-12-19 ENCOUNTER — Encounter: Payer: Self-pay | Admitting: Adult Health Nurse Practitioner

## 2020-12-19 VITALS — BP 124/86 | HR 81 | Temp 97.7°F | Ht 73.0 in | Wt 242.0 lb

## 2020-12-19 DIAGNOSIS — H60392 Other infective otitis externa, left ear: Secondary | ICD-10-CM

## 2020-12-19 DIAGNOSIS — I10 Essential (primary) hypertension: Secondary | ICD-10-CM | POA: Diagnosis not present

## 2020-12-19 MED ORDER — OFLOXACIN 0.3 % OT SOLN: 10.0000 [drp] | Freq: Every day | OTIC | 0 refills | Status: DC

## 2020-12-19 NOTE — Progress Notes (Signed)
Assessment and Plan:  Jaime Little was seen today for acute visit.  Diagnoses and all orders for this visit:  Other infective acute otitis externa of left ear -     ofloxacin (FLOXIN) 0.3 % OTIC solution; Place 10 drops into the left ear daily. 7 days Continue to monitor hearing.  If no improvement consider ENT for evaluation.  Essential hypertension Continue current medications: Monitor blood pressure at home; call if consistently over 130/80 Continue DASH diet.   Reminder to go to the ER if any CP, SOB, nausea, dizziness, severe HA, changes vision/speech, left arm numbness and tingling and jaw pain.   Further disposition pending results of labs. Discussed med's effects and SE's.   Over 30 minutes of face to face interview, exam, counseling, chart review, and critical decision making was performed.   Future Appointments  Date Time Provider Department Center  01/18/2021 10:00 AM Judd Gaudier, NP [facility] None    ------------------------------------------------------------------------------------------------------------------   HPI 30 y.o.male presents for evaluation of the left ear.  Reports that two weeks ago there was a piece of hot metal that went into his left ear.  He was laying on the ground looking up welding.  He reports he immediately rubbed his ear to get it out.  Reports he had a decrease in his hearing since then.  He reports mild drainage to his pillow a couple nights but has resolved.  He reports his hearing has improved but his wife insisted that he come have it checked out.  No past medical history on file.   Not on File  Current Outpatient Medications on File Prior to Visit  Medication Sig  . aspirin 81 MG chewable tablet Chew 81 mg daily by mouth.  Marland Kitchen azelastine (ASTELIN) 0.1 % nasal spray Use 1 to 2 Sprays each Nares 1 or 2 x /day  . Cetirizine HCl (ZYRTEC ALLERGY PO) Take by mouth as needed.   . Cholecalciferol 5000 units TABS Take 5,000 Units by mouth daily.    Marland Kitchen olmesartan (BENICAR) 40 MG tablet Take      1 tablet      Daily       for BP  . omeprazole (PRILOSEC) 40 MG capsule Take     1 capsule    Daily     to prevent Heartburn & Indigestion  . fluticasone (FLONASE) 50 MCG/ACT nasal spray Place 1 spray into both nostrils daily. (Patient not taking: Reported on 01/13/2020)   No current facility-administered medications on file prior to visit.    ROS: all negative except above.   Physical Exam:  BP 124/86   Pulse 81   Temp 97.7 F (36.5 C)   Ht 6\' 1"  (1.854 m)   Wt 242 lb (109.8 kg)   SpO2 97%   BMI 31.93 kg/m   General Appearance: Well nourished, in no apparent distress. Eyes: PERRLA, EOMs, conjunctiva no swelling or erythema Sinuses: No Frontal/maxillary tenderness ENT/Mouth: Ext aud canals clear, TMs without erythema, bulging on right.  Left TM intact, erythema noted in ear canal, TM telangiectasia, serous noted behind TM . No erythema, swelling, or exudate on post pharynx.  Tonsils not swollen or erythematous. Hearing normal.  Neck: Supple, thyroid normal.  Respiratory: Respiratory effort normal, BS equal bilaterally without rales, rhonchi, wheezing or stridor.  Cardio: RRR with no MRGs. Brisk peripheral pulses without edema.  Abdomen: Soft, + BS.  Non tender, no guarding, rebound, hernias, masses. Lymphatics: Non tender without lymphadenopathy.  Musculoskeletal: Full ROM, 5/5 strength, normal gait.  Skin: Warm, dry without rashes, lesions, ecchymosis.  Neuro: Cranial nerves intact. Normal muscle tone, no cerebellar symptoms. Sensation intact.  Psych: Awake and oriented X 3, normal affect, Insight and Judgment appropriate.     Elder Negus, NP 3:55 PM [facility]

## 2021-01-05 ENCOUNTER — Encounter: Payer: BLUE CROSS/BLUE SHIELD | Admitting: Adult Health

## 2021-01-17 NOTE — Progress Notes (Signed)
Complete Physical  Assessment and Plan: Health Maintenance- Discussed STD testing, safe sex, alcohol and drug awareness, drinking and driving dangers, wearing a seat belt and general safety measures for young adult. Recommended wearing sunscreen daily for skin cancer risk reduction.   Encounter for routine adult health examination without abnormal findings  Essential hypertension Elevated x 2 in office today; however reports very well controlled at home Continue medication: olmesartan 40 mg daily  Keep log and follow up NV 1 month recheck - bring BP cuff Consider adding HCTZ if diastolic persistently 80+ Monitor blood pressure at home; call if consistently over 130/80 Continue DASH diet.   Reminder to go to the ER if any CP, SOB, nausea, dizziness, severe HA, changes vision/speech, left arm numbness and tingling and jaw pain. -     CBC with Differential/Platelet -     COMPLETE METABOLIC PANEL WITH GFR -     Magnesium -     Urinalysis, Routine w reflex microscopic -     Microalbumin / creatinine urine ratio -     EKG 12-Lead  Non-seasonal allergic rhinitis due to other allergic trigger Continue OTC allergy pills, nasal sprays, avoid triggers  Gastroesophageal reflux disease, esophagitis presence not specified Well managed on current medications, wasn't well controlled on H2i Discussed diet, avoiding triggers and other lifestyle changes -     Magnesium  Vitamin D deficiency -     VITAMIN D 25 Hydroxy (Vit-D Deficiency, Fractures)  Obesity (BMI 30.0-34.9) Long discussion about weight loss, diet, and exercise Recommended diet heavy in fruits and veggies and low in animal meats, cheeses, and dairy products, appropriate calorie intake Patient will work on reducing intake when working less, cut back on sugar/flour, increase fruits/veggies (aim to eat at least a serving with each meal) Discussed appropriate weight for height and initial goal (<215 lb) Follow up at next visit  Mixed  hyperlipidemia Improved with chol support supplement;  Continue low cholesterol diet and exercise; advised to increase fiber intake Check lipid panel.  -     Lipid panel -     TSH  Family history of death due to heart problem at 90 years of age or younger Aggressive risk factor management Control blood pressure, cholesterol, glucose, increase exercise, maintain weight Will refer for screening at age 35    Discussed med's effects and SE's. Screening labs and tests as requested with regular follow-up as recommended. Over 40 minutes of exam, counseling, chart review and critical decision making was performed  Future Appointments  Date Time Provider Department Center  03/07/2021 10:30 AM Judd Gaudier, NP [facility] None  06/25/2021  9:30 AM Judd Gaudier, NP [facility] None  01/21/2022 10:00 AM Judd Gaudier, NP [facility] None    HPI  This very nice 29 y.o.male presents for complete physical.has Hypertension; GERD (gastroesophageal reflux disease); Allergic rhinitis due to allergen; Vitamin D deficiency; Family history of death due to heart problem at 22 years of age or younger; Hyperlipidemia; and Obesity (BMI 30.0-34.9) on their problem list.   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   Patient reports no complaints at this time.   Uses astalin PRN allergies.   BMI is Body mass index is 31.87 kg/m., he was working on diet,  He has been skipping french fries, getting okra or zuccini, choosing grilled proteins more. He admits poor fruit/veggie intake.   He works on a farm daily. He cut out sodas, but drinks 1 glass  of sweet tea. He admits to no drinking much water, has a few gatorades.  Wt Readings from Last 3 Encounters:  01/18/21 235 lb (106.6 kg)  12/19/20 242 lb (109.8 kg)  07/17/20 234 lb (106.1 kg)   he has a diagnosis of GERD which is currently managed by omeprazole 40 mg daily he reports symptoms is currently well controlled, and  denies breakthrough reflux, burning in chest, hoarseness or cough.    His blood pressure has been controlled at home (115- 120s over 65-70s), today their BP is BP: (!) 128/98, recheck 140/96  He does workout. He denies chest pain, shortness of breath, dizziness.   He is not on cholesterol medication, taking a cholesteol health OTC supplement [facility]). His LDL cholesterol is not at goal. The cholesterol last visit was:   Lab Results  Component Value Date   CHOL 169 07/17/2020   HDL 34 (L) 07/17/2020   LDLCALC 103 (H) 07/17/2020   TRIG 198 (H) 07/17/2020   CHOLHDL 5.0 (H) 07/17/2020    He has been working on diet and exercise for glucose management. Last A1C in the office was:  Lab Results  Component Value Date   HGBA1C 5.0 01/05/2019   Lab Results  Component Value Date   GFRNONAA 122 07/17/2020   Patient is on Vitamin D supplement.   Lab Results  Component Value Date   VD25OH 64 01/13/2020       Current Medications:  Current Outpatient Medications on File Prior to Visit  Medication Sig Dispense Refill  . aspirin 81 MG chewable tablet Chew 81 mg daily by mouth.    Marland Kitchen azelastine (ASTELIN) 0.1 % nasal spray Use 1 to 2 Sprays each Nares 1 or 2 x /day 90 mL 3  . Cholecalciferol 5000 units TABS Take 5,000 Units by mouth daily.     Marland Kitchen olmesartan (BENICAR) 40 MG tablet Take      1 tablet      Daily       for BP 90 tablet 1  . omeprazole (PRILOSEC) 40 MG capsule Take     1 capsule    Daily     to prevent Heartburn & Indigestion 90 capsule 0  . Phytosterol Esters (CHOLEST CARE PO) Take by mouth daily. Takes two tablets daily     No current facility-administered medications on file prior to visit.   Health Maintenance:    There is no immunization history on file for this patient.  TD/TDAP: 2017 Influenza: declines Pneumovax: n/a HPV vaccines: Had as a teen, 2/2 Covid 19: declines  Sexually Active: yes STD testing offered, declines Last Dental Exam: goes q 6 months, last  2021 Last Eye Exam: Wears contacts, goes annually 2021, will schedule this year  Allergies: No Known Allergies Medical History:  has Hypertension; GERD (gastroesophageal reflux disease); Allergic rhinitis due to allergen; Vitamin D deficiency; Family history of death due to heart problem at 65 years of age or younger; Hyperlipidemia; and Obesity (BMI 30.0-34.9) on their problem list. Surgical History:  He  has a past surgical history that includes Wisdom tooth extraction (2014). Family History:  Hisfamily history includes Diabetes in his maternal grandmother; Heart attack (age of onset: 72) in his paternal uncle; Heart attack (age of onset: 31) in his paternal grandfather; Heart failure (age of onset: 27) in his paternal grandmother; Hypertension in his father; Hypothyroidism in his mother. Social History:   reports that he has never smoked. His smokeless tobacco use includes chew. He reports that  he does not drink alcohol and does not use drugs.  Review of Systems: Review of Systems  Constitutional: Negative for malaise/fatigue and weight loss.  HENT: Negative for hearing loss and tinnitus.   Eyes: Negative for blurred vision and double vision.  Respiratory: Negative for cough, sputum production, shortness of breath and wheezing.   Cardiovascular: Negative for chest pain, palpitations, orthopnea, claudication, leg swelling and PND.  Gastrointestinal: Negative for abdominal pain, blood in stool, constipation, diarrhea, heartburn, melena, nausea and vomiting.  Genitourinary: Negative.   Musculoskeletal: Negative for falls, joint pain and myalgias.  Skin: Negative for rash.  Neurological: Negative for dizziness, tingling, sensory change, weakness and headaches.  Endo/Heme/Allergies: Negative for polydipsia.  Psychiatric/Behavioral: Negative.  Negative for depression, memory loss, substance abuse and suicidal ideas. The patient is not nervous/anxious and does not have insomnia.   All other  systems reviewed and are negative.   Physical Exam: Estimated body mass index is 31.87 kg/m as calculated from the following:   Height as of this encounter: 6' (1.829 m).   Weight as of this encounter: 235 lb (106.6 kg). BP (!) 128/98   Pulse 73   Temp (!) 97.3 F (36.3 C)   Ht 6' (1.829 m)   Wt 235 lb (106.6 kg)   SpO2 95%   BMI 31.87 kg/m  General Appearance: Well nourished, in no apparent distress.  Eyes: PERRLA, EOMs, conjunctiva no swelling or erythema Sinuses: No Frontal/maxillary tenderness  ENT/Mouth: Ext aud canals clear, normal light reflex with TMs without erythema, bulging. Good dentition. No erythema, swelling, or exudate on post pharynx. Tonsils not swollen or erythematous. Hearing normal.  Neck: Supple, thyroid normal. No bruits  Respiratory: Respiratory effort normal, BS equal bilaterally without rales, rhonchi, wheezing or stridor.  Cardio: RRR without murmurs, rubs or gallops. Brisk peripheral pulses without edema.  Chest: symmetric, with normal excursions and percussion.  Abdomen: Soft, nontender, no guarding, rebound, hernias, masses, or organomegaly.  Lymphatics: Non tender without lymphadenopathy.  Genitourinary: defer, no issues Musculoskeletal: Full ROM all peripheral extremities,5/5 strength, and normal gait.  Skin: Warm, dry without rashes, lesions, ecchymosis. Neuro: Cranial nerves intact, reflexes equal bilaterally. Normal muscle tone, no cerebellar symptoms. Sensation intact.  Psych: Awake and oriented X 3, normal affect, Insight and Judgment appropriate.   EKG:  NSR, NSTC  Carlyon Shadow Xavier Fournier 11:06 AM [facility]

## 2021-01-18 ENCOUNTER — Other Ambulatory Visit: Payer: Self-pay

## 2021-01-18 ENCOUNTER — Ambulatory Visit (INDEPENDENT_AMBULATORY_CARE_PROVIDER_SITE_OTHER): Payer: 59 | Admitting: Adult Health

## 2021-01-18 ENCOUNTER — Encounter: Payer: Self-pay | Admitting: Adult Health

## 2021-01-18 VITALS — BP 128/98 | HR 73 | Temp 97.3°F | Ht 72.0 in | Wt 235.0 lb

## 2021-01-18 DIAGNOSIS — Z8241 Family history of sudden cardiac death: Secondary | ICD-10-CM

## 2021-01-18 DIAGNOSIS — Z136 Encounter for screening for cardiovascular disorders: Secondary | ICD-10-CM | POA: Diagnosis not present

## 2021-01-18 DIAGNOSIS — Z79899 Other long term (current) drug therapy: Secondary | ICD-10-CM | POA: Diagnosis not present

## 2021-01-18 DIAGNOSIS — I10 Essential (primary) hypertension: Secondary | ICD-10-CM | POA: Diagnosis not present

## 2021-01-18 DIAGNOSIS — E782 Mixed hyperlipidemia: Secondary | ICD-10-CM

## 2021-01-18 DIAGNOSIS — Z1329 Encounter for screening for other suspected endocrine disorder: Secondary | ICD-10-CM

## 2021-01-18 DIAGNOSIS — Z13220 Encounter for screening for lipoid disorders: Secondary | ICD-10-CM | POA: Diagnosis not present

## 2021-01-18 DIAGNOSIS — E559 Vitamin D deficiency, unspecified: Secondary | ICD-10-CM | POA: Diagnosis not present

## 2021-01-18 DIAGNOSIS — K219 Gastro-esophageal reflux disease without esophagitis: Secondary | ICD-10-CM

## 2021-01-18 DIAGNOSIS — Z0000 Encounter for general adult medical examination without abnormal findings: Secondary | ICD-10-CM

## 2021-01-18 DIAGNOSIS — Z131 Encounter for screening for diabetes mellitus: Secondary | ICD-10-CM

## 2021-01-18 DIAGNOSIS — Z1389 Encounter for screening for other disorder: Secondary | ICD-10-CM | POA: Diagnosis not present

## 2021-01-18 DIAGNOSIS — J3089 Other allergic rhinitis: Secondary | ICD-10-CM

## 2021-01-18 DIAGNOSIS — E669 Obesity, unspecified: Secondary | ICD-10-CM

## 2021-01-18 NOTE — Patient Instructions (Addendum)
Jaime Little , Thank you for taking time to come for your Annual Wellness Visit. I appreciate your ongoing commitment to your health goals. Please review the following plan we discussed and let me know if I can assist you in the future.   These are the goals we discussed: Goals    . Blood Pressure < 130/80    . DIET - EAT MORE FRUITS AND VEGETABLES     Try to eat fruit or veggies with each meal.     . DIET - REDUCE SUGAR INTAKE     Recommend cutting down on sugar intake  - the American Heart Association recommends no more than 9 teaspoons (38 g) of added sugar for men daily, and 6 teaspoons (25 g) for women. Added sugar can be in many things that you might not expect - salad dressings, bread that is not home made, "all natural" fruit juice, etc., most processed foods contain hidden sugars. Consider looking at labels and being aware of how much sugar you are consuming in a day. Less is always better for sugar; sugar reduces your body's immune response, and damages blood vessels, leading to increased risk of many diseases.      Marland Kitchen LDL CALC < 100    . Weight (lb) < 215 lb (97.5 kg)       This is a list of the screening recommended for you and due dates:  Health Maintenance  Topic Date Due  .  Hepatitis C: One time screening is recommended by Center for Disease Control  (CDC) for  adults born from [birth year] through 1963.   Never done  . COVID-19 Vaccine (1) Never done  . Flu Shot  Never done  . Tetanus Vaccine  11/18/2025  . HPV Vaccine  Aged Out  . HIV Screening  Discontinued     Please bring your blood pressure cuff with you -    HYPERTENSION INFORMATION  Monitor your blood pressure at home, please keep a record and bring that in with you to your next office visit.   Go to the ER if any CP, SOB, nausea, dizziness, severe HA, changes vision/speech  Testing/Procedures: HOW TO TAKE YOUR BLOOD PRESSURE:  Rest 5 minutes before taking your blood pressure.  Don't smoke or drink  caffeinated beverages for at least 30 minutes before.  Take your blood pressure before (not after) you eat.  Sit comfortably with your back supported and both feet on the floor (don't cross your legs).  Elevate your arm to heart level on a table or a desk.  Use the proper sized cuff. It should fit smoothly and snugly around your bare upper arm. There should be enough room to slip a fingertip under the cuff. The bottom edge of the cuff should be 1 inch above the crease of the elbow.  Due to a recent study, SPRINT, we have changed our goal for the systolic or top blood pressure number. Ideally we want your top number at 120.  In the [facility] Trial, 5000 people were randomized to a goal BP of 120 and 5000 people were randomized to a goal BP of less than 140. The patients with the goal BP at 120 had LESS DEMENTIA, LESS HEART ATTACKS, AND LESS STROKES, AS WELL AS OVERALL DECREASED MORTALITY OR DEATH RATE.   There was another study that showed taking your blood pressure medications at night decrease cardiovascular events.  However if you are on a fluid pill, please take this in  the morning.   If you are willing, our goal BP is the top number of 120.   Your most recent BP: BP: (!) 128/98   Take your medications faithfully as instructed. Maintain a healthy weight. Get at least 150 minutes of aerobic exercise per week. Minimize salt intake. Minimize alcohol intake  DASH Eating Plan DASH stands for "Dietary Approaches to Stop Hypertension." The DASH eating plan is a healthy eating plan that has been shown to reduce high blood pressure (hypertension). Additional health benefits may include reducing the risk of type 2 diabetes mellitus, heart disease, and stroke. The DASH eating plan may also help with weight loss. WHAT DO I NEED TO KNOW ABOUT THE DASH EATING PLAN? For the DASH eating plan, you will follow these general guidelines:  Choose foods with a percent daily value for sodium of less than 5%  (as listed on the food label).  Use salt-free seasonings or herbs instead of table salt or sea salt.  Check with your health care provider or pharmacist before using salt substitutes.  Eat lower-sodium products, often labeled as "lower sodium" or "no salt added."  Eat fresh foods.  Eat more vegetables, fruits, and low-fat dairy products.  Choose whole grains. Look for the word "whole" as the first word in the ingredient list.  Choose fish and skinless chicken or [place] more often than red meat. Limit fish, poultry, and meat to 6 oz (170 g) each day.  Limit sweets, desserts, sugars, and sugary drinks.  Choose heart-healthy fats.  Limit cheese to 1 oz (28 g) per day.  Eat more home-cooked food and less restaurant, buffet, and fast food.  Limit fried foods.  Cook foods using methods other than frying.  Limit canned vegetables. If you do use them, rinse them well to decrease the sodium.  When eating at a restaurant, ask that your food be prepared with less salt, or no salt if possible. WHAT FOODS CAN I EAT? Seek help from a dietitian for individual calorie needs. Grains Whole grain or whole wheat bread. Bullard rice. Whole grain or whole wheat pasta. Quinoa, bulgur, and whole grain cereals. Low-sodium cereals. Corn or whole wheat flour tortillas. Whole grain cornbread. Whole grain crackers. Low-sodium crackers. Vegetables Fresh or frozen vegetables (raw, steamed, roasted, or grilled). Low-sodium or reduced-sodium tomato and vegetable juices. Low-sodium or reduced-sodium tomato sauce and paste. Low-sodium or reduced-sodium canned vegetables.  Fruits All fresh, canned (in natural juice), or frozen fruits. Meat and Other Protein Products Ground beef (85% or leaner), grass-fed beef, or beef trimmed of fat. Skinless chicken or [place]. Ground chicken or [place]. Pork trimmed of fat. All fish and seafood. Eggs. Dried beans, peas, or lentils. Unsalted nuts and seeds. Unsalted canned  beans. Dairy Low-fat dairy products, such as skim or 1% milk, 2% or reduced-fat cheeses, low-fat ricotta or cottage cheese, or plain low-fat yogurt. Low-sodium or reduced-sodium cheeses. Fats and Oils Tub margarines without trans fats. Light or reduced-fat mayonnaise and salad dressings (reduced sodium). Avocado. Safflower, olive, or canola oils. Natural peanut or almond butter. Other Unsalted popcorn and pretzels. The items listed above may not be a complete list of recommended foods or beverages. Contact your dietitian for more options. WHAT FOODS ARE NOT RECOMMENDED? Grains White bread. White pasta. White rice. Refined cornbread. Bagels and croissants. Crackers that contain trans fat. Vegetables Creamed or fried vegetables. Vegetables in a cheese sauce. Regular canned vegetables. Regular canned tomato sauce and paste. Regular tomato and vegetable juices. Fruits Dried fruits. Canned  fruit in light or heavy syrup. Fruit juice. Meat and Other Protein Products Fatty cuts of meat. Ribs, chicken wings, bacon, sausage, bologna, salami, chitterlings, fatback, hot dogs, bratwurst, and packaged luncheon meats. Salted nuts and seeds. Canned beans with salt. Dairy Whole or 2% milk, cream, half-and-half, and cream cheese. Whole-fat or sweetened yogurt. Full-fat cheeses or blue cheese. Nondairy creamers and whipped toppings. Processed cheese, cheese spreads, or cheese curds. Condiments Onion and garlic salt, seasoned salt, table salt, and sea salt. Canned and packaged gravies. Worcestershire sauce. Tartar sauce. Barbecue sauce. Teriyaki sauce. Soy sauce, including reduced sodium. Steak sauce. Fish sauce. Oyster sauce. Cocktail sauce. Horseradish. Ketchup and mustard. Meat flavorings and tenderizers. Bouillon cubes. Hot sauce. Tabasco sauce. Marinades. Taco seasonings. Relishes. Fats and Oils Butter, stick margarine, lard, shortening, ghee, and bacon fat. Coconut, palm kernel, or palm oils. Regular salad  dressings. Other Pickles and olives. Salted popcorn and pretzels. The items listed above may not be a complete list of foods and beverages to avoid. Contact your dietitian for more information. WHERE CAN I FIND MORE INFORMATION? National Heart, Lung, and Blood Institute: [employer] Document Released: 10/24/2011 Document Revised: 03/21/2014 Document Reviewed: 09/08/2013 [facility] Patient Information 2015 [place], [place]. This information is not intended to replace advice given to you by your health care provider. Make sure you discuss any questions you have with your health care provider.

## 2021-01-19 LAB — COMPLETE METABOLIC PANEL WITH GFR
AG Ratio: 1.7 (calc) (ref 1.0–2.5)
ALT: 39 U/L (ref 9–46)
AST: 30 U/L (ref 10–40)
Albumin: 4.5 g/dL (ref 3.6–5.1)
Alkaline phosphatase (APISO): 80 U/L (ref 36–130)
BUN: 10 mg/dL (ref 7–25)
CO2: 27 mmol/L (ref 20–32)
Calcium: 9.8 mg/dL (ref 8.6–10.3)
Chloride: 105 mmol/L (ref 98–110)
Creat: 0.85 mg/dL (ref 0.60–1.35)
GFR, Est African American: 138 mL/min/{1.73_m2} (ref >=60)
GFR, Est Non African American: 119 mL/min/{1.73_m2} (ref >=60)
Globulin: 2.7 g/dL (calc) (ref 1.9–3.7)
Glucose, Bld: 92 mg/dL (ref 65–99)
Potassium: 4.7 mmol/L (ref 3.5–5.3)
Sodium: 141 mmol/L (ref 135–146)
Total Bilirubin: 0.4 mg/dL (ref 0.2–1.2)
Total Protein: 7.2 g/dL (ref 6.1–8.1)

## 2021-01-19 LAB — LIPID PANEL
Cholesterol: 182 mg/dL (ref <200)
HDL: 33 mg/dL — ABNORMAL LOW (ref >=40)
LDL Cholesterol (Calc): 106 mg/dL (calc) — ABNORMAL HIGH
Non-HDL Cholesterol (Calc): 149 mg/dL (calc) — ABNORMAL HIGH (ref <130)
Total CHOL/HDL Ratio: 5.5 (calc) — ABNORMAL HIGH (ref <5.0)
Triglycerides: 326 mg/dL — ABNORMAL HIGH (ref <150)

## 2021-01-19 LAB — CBC WITH DIFFERENTIAL/PLATELET
Absolute Monocytes: 448 cells/uL (ref 200–950)
Basophils Absolute: 77 cells/uL (ref 0–200)
Basophils Relative: 1.2 %
Eosinophils Absolute: 70 cells/uL (ref 15–500)
Eosinophils Relative: 1.1 %
HCT: 45.8 % (ref 38.5–50.0)
Hemoglobin: 15.5 g/dL (ref 13.2–17.1)
Lymphs Abs: 1920 cells/uL (ref 850–3900)
MCH: 28.3 pg (ref 27.0–33.0)
MCHC: 33.8 g/dL (ref 32.0–36.0)
MCV: 83.7 fL (ref 80.0–100.0)
MPV: 10.6 fL (ref 7.5–12.5)
Monocytes Relative: 7 %
Neutro Abs: 3885 cells/uL (ref 1500–7800)
Neutrophils Relative %: 60.7 %
Platelets: 240 10*3/uL (ref 140–400)
RBC: 5.47 10*6/uL (ref 4.20–5.80)
RDW: 13 % (ref 11.0–15.0)
Total Lymphocyte: 30 %
WBC: 6.4 10*3/uL (ref 3.8–10.8)

## 2021-01-19 LAB — URINALYSIS, ROUTINE W REFLEX MICROSCOPIC
Bilirubin Urine: NEGATIVE
Glucose, UA: NEGATIVE
Hgb urine dipstick: NEGATIVE
Ketones, ur: NEGATIVE
Leukocytes,Ua: NEGATIVE
Nitrite: NEGATIVE
Protein, ur: NEGATIVE
Specific Gravity, Urine: 1.022 (ref 1.001–1.03)
pH: 6 (ref 5.0–8.0)

## 2021-01-19 LAB — TSH: TSH: 1.2 mIU/L (ref 0.40–4.50)

## 2021-01-19 LAB — HEMOGLOBIN A1C
Hgb A1c MFr Bld: 5.1 % of total Hgb (ref <5.7)
Mean Plasma Glucose: 100 mg/dL
eAG (mmol/L): 5.5 mmol/L

## 2021-01-19 LAB — MAGNESIUM: Magnesium: 2.1 mg/dL (ref 1.5–2.5)

## 2021-01-19 LAB — MICROALBUMIN / CREATININE URINE RATIO
Creatinine, Urine: 204 mg/dL (ref 20–320)
Microalb Creat Ratio: 2 mcg/mg creat (ref <30)
Microalb, Ur: 0.4 mg/dL

## 2021-01-19 LAB — VITAMIN D 25 HYDROXY (VIT D DEFICIENCY, FRACTURES): Vit D, 25-Hydroxy: 54 ng/mL (ref 30–100)

## 2021-02-28 ENCOUNTER — Other Ambulatory Visit: Payer: Self-pay | Admitting: Internal Medicine

## 2021-02-28 DIAGNOSIS — I10 Essential (primary) hypertension: Secondary | ICD-10-CM

## 2021-02-28 DIAGNOSIS — K219 Gastro-esophageal reflux disease without esophagitis: Secondary | ICD-10-CM

## 2021-03-07 ENCOUNTER — Other Ambulatory Visit: Payer: Self-pay | Admitting: Adult Health

## 2021-03-07 ENCOUNTER — Ambulatory Visit (INDEPENDENT_AMBULATORY_CARE_PROVIDER_SITE_OTHER): Payer: 59

## 2021-03-07 ENCOUNTER — Other Ambulatory Visit: Payer: Self-pay

## 2021-03-07 ENCOUNTER — Ambulatory Visit: Payer: 59 | Admitting: Adult Health

## 2021-03-07 DIAGNOSIS — I10 Essential (primary) hypertension: Secondary | ICD-10-CM

## 2021-03-07 DIAGNOSIS — Z79899 Other long term (current) drug therapy: Secondary | ICD-10-CM

## 2021-03-07 MED ORDER — HYDROCHLOROTHIAZIDE 25 MG PO TABS
25.0000 mg | ORAL_TABLET | Freq: Every day | ORAL | 0 refills | Status: DC
Start: 2021-03-07 — End: 2021-05-28

## 2021-03-07 NOTE — Progress Notes (Signed)
Patient presents to the office for a nurse visit to recheck BP. Is taking Olmesartan, 40mg  daily. Has been monitoring at home with a range from 130-150's/70-90's. Brought his BP cuff today. BP in the office today was 130/82.

## 2021-05-28 ENCOUNTER — Other Ambulatory Visit: Payer: Self-pay | Admitting: Adult Health

## 2021-05-28 DIAGNOSIS — K219 Gastro-esophageal reflux disease without esophagitis: Secondary | ICD-10-CM

## 2021-06-22 NOTE — Progress Notes (Signed)
FOLLOW UP  Assessment and Plan:   Hypertension Fairly controlled with current medications;  Monitor blood pressure at home; patient to call if consistently greater than 130/80 Continue DASH diet.   Reminder to go to the ER if any CP, SOB, nausea, dizziness, severe HA, changes vision/speech, left arm numbness and tingling and jaw pain.  Cholesterol Mild elevations fairly controlled by lifestyle  Continue low cholesterol diet and exercise.  Check lipid panel.   Obesity with co morbidities Commended progress with weight loss- Long discussion about weight loss, diet, and exercise Recommended diet heavy in fruits and veggies and low in animal meats, cheeses, and dairy products, appropriate calorie intake Discussed ideal weight for height  Patient will work on watching portions, cholesterol restricted diet, limiting sugar intake Will follow up in 6 months  Vitamin D Def At goal at last visit; continue supplementation to maintain goal of 60-100 Defer Vit D level  Continue diet and meds as discussed. Further disposition pending results of labs. Discussed med's effects and SE's.   Over 30 minutes of exam, counseling, chart review, and critical decision making was performed.   Future Appointments  Date Time Provider Department Center  01/21/2022 10:00 AM Judd Gaudier, NP [facility] None    ----------------------------------------------------------------------------------------------------------------------  HPI 30 y.o. male  presents for 6 month follow up on hypertension, cholesterol, weight and vitamin D deficiency.   BMI is Body mass index is 32.69 kg/m., he has been working on diet and exercise, working more during the summer. He reports has reduced soda/sweet tea (1/day), doing more water ~1 gallon/day.  Wt Readings from Last 3 Encounters:  06/25/21 241 lb (109.3 kg)  03/07/21 239 lb (108.4 kg)  01/18/21 235 lb (106.6 kg)   His blood pressure has been controlled at home  (120/70s), today their BP is BP: 122/88  He does workout. He denies chest pain, shortness of breath, dizziness.   He is not on cholesterol medication and denies myalgias. His cholesterol is not at goal. The cholesterol last visit was:   Lab Results  Component Value Date   CHOL 182 01/18/2021   HDL 33 (L) 01/18/2021   LDLCALC 106 (H) 01/18/2021   TRIG 326 (H) 01/18/2021   CHOLHDL 5.5 (H) 01/18/2021   Last A1C in the office was:  Lab Results  Component Value Date   HGBA1C 5.1 01/18/2021   Patient is on Vitamin D supplement.   Lab Results  Component Value Date   VD25OH 54 01/18/2021       Current Medications:  Current Outpatient Medications on File Prior to Visit  Medication Sig   aspirin 81 MG chewable tablet Chew 81 mg daily by mouth.   Cholecalciferol 5000 units TABS Take 5,000 Units by mouth daily.    hydrochlorothiazide (HYDRODIURIL) 25 MG tablet TAKE 1 TABLET BY MOUTH EVERY MORNING WITH BREAKFAST   olmesartan (BENICAR) 40 MG tablet TAKE 1 TABLET BY MOUTH DAILY FOR BLOOD PRESSURE   omeprazole (PRILOSEC) 40 MG capsule TAKE 1 CAPSULE BY MOUTH DAILY TO PREVENTHEARTBURN AND INDIGESTION   Phytosterol Esters (CHOLEST CARE PO) Take by mouth daily. Takes two tablets daily   azelastine (ASTELIN) 0.1 % nasal spray Use 1 to 2 Sprays each Nares 1 or 2 x /day (Patient not taking: Reported on 06/25/2021)   No current facility-administered medications on file prior to visit.     Allergies: No Known Allergies   Medical History:  Past Medical History:  Diagnosis Date   GERD without esophagitis    Hypertension  Family history- Reviewed and unchanged Social history- Reviewed and unchanged   Review of Systems:  Review of Systems  Constitutional:  Negative for malaise/fatigue and weight loss.  HENT:  Negative for hearing loss and tinnitus.   Eyes:  Negative for blurred vision and double vision.  Respiratory:  Negative for cough, shortness of breath and wheezing.    Cardiovascular:  Negative for chest pain, palpitations, orthopnea, claudication and leg swelling.  Gastrointestinal:  Negative for abdominal pain, blood in stool, constipation, diarrhea, heartburn, melena, nausea and vomiting.  Genitourinary: Negative.   Musculoskeletal:  Negative for joint pain and myalgias.  Skin:  Negative for rash.  Neurological:  Negative for dizziness, tingling, sensory change, weakness and headaches.  Endo/Heme/Allergies:  Negative for polydipsia.  Psychiatric/Behavioral: Negative.    All other systems reviewed and are negative.   Physical Exam: BP 122/88   Pulse 77   Temp (!) 97.3 F (36.3 C)   Wt 241 lb (109.3 kg)   SpO2 99%   BMI 32.69 kg/m  Wt Readings from Last 3 Encounters:  06/25/21 241 lb (109.3 kg)  03/07/21 239 lb (108.4 kg)  01/18/21 235 lb (106.6 kg)   General Appearance: Well nourished, in no apparent distress. Eyes: PERRLA, EOMs, conjunctiva no swelling or erythema Sinuses: No Frontal/maxillary tenderness ENT/Mouth: Ext aud canals clear, TMs without erythema, bulging. No erythema, swelling, or exudate on post pharynx.  Tonsils not swollen or erythematous. Hearing normal.  Neck: Supple, thyroid normal.  Respiratory: Respiratory effort normal, BS equal bilaterally without rales, rhonchi, wheezing or stridor.  Cardio: RRR with no MRGs. Brisk peripheral pulses without edema.  Abdomen: Soft, + BS.  Non tender, no guarding, rebound, hernias, masses. Lymphatics: Non tender without lymphadenopathy.  Musculoskeletal: Full ROM, 5/5 strength, Normal gait Skin: Warm, dry without rashes, lesions, ecchymosis.  Neuro: Cranial nerves intact. No cerebellar symptoms.  Psych: Awake and oriented X 3, normal affect, Insight and Judgment appropriate.    Dan Maker, NP 10:09 AM [facility]

## 2021-06-25 ENCOUNTER — Encounter: Payer: Self-pay | Admitting: Adult Health

## 2021-06-25 ENCOUNTER — Ambulatory Visit (INDEPENDENT_AMBULATORY_CARE_PROVIDER_SITE_OTHER): Payer: 59 | Admitting: Adult Health

## 2021-06-25 ENCOUNTER — Other Ambulatory Visit: Payer: Self-pay

## 2021-06-25 VITALS — BP 122/88 | HR 77 | Temp 97.3°F | Wt 241.0 lb

## 2021-06-25 DIAGNOSIS — K219 Gastro-esophageal reflux disease without esophagitis: Secondary | ICD-10-CM

## 2021-06-25 DIAGNOSIS — E669 Obesity, unspecified: Secondary | ICD-10-CM

## 2021-06-25 DIAGNOSIS — E782 Mixed hyperlipidemia: Secondary | ICD-10-CM | POA: Diagnosis not present

## 2021-06-25 DIAGNOSIS — E559 Vitamin D deficiency, unspecified: Secondary | ICD-10-CM

## 2021-06-25 DIAGNOSIS — Z79899 Other long term (current) drug therapy: Secondary | ICD-10-CM | POA: Diagnosis not present

## 2021-06-25 DIAGNOSIS — I10 Essential (primary) hypertension: Secondary | ICD-10-CM

## 2021-06-26 LAB — COMPLETE METABOLIC PANEL WITH GFR
AG Ratio: 1.6 (calc) (ref 1.0–2.5)
ALT: 46 U/L (ref 9–46)
AST: 30 U/L (ref 10–40)
Albumin: 4.5 g/dL (ref 3.6–5.1)
Alkaline phosphatase (APISO): 56 U/L (ref 36–130)
BUN: 9 mg/dL (ref 7–25)
CO2: 29 mmol/L (ref 20–32)
Calcium: 10 mg/dL (ref 8.6–10.3)
Chloride: 101 mmol/L (ref 98–110)
Creat: 0.86 mg/dL (ref 0.60–1.24)
Globulin: 2.8 g/dL (calc) (ref 1.9–3.7)
Glucose, Bld: 91 mg/dL (ref 65–99)
Potassium: 4.1 mmol/L (ref 3.5–5.3)
Sodium: 139 mmol/L (ref 135–146)
Total Bilirubin: 0.6 mg/dL (ref 0.2–1.2)
Total Protein: 7.3 g/dL (ref 6.1–8.1)
eGFR: 121 mL/min/{1.73_m2} (ref >=60)

## 2021-06-26 LAB — MAGNESIUM: Magnesium: 2 mg/dL (ref 1.5–2.5)

## 2021-06-26 LAB — LIPID PANEL
Cholesterol: 205 mg/dL — ABNORMAL HIGH (ref <200)
HDL: 38 mg/dL — ABNORMAL LOW (ref >=40)
LDL Cholesterol (Calc): 130 mg/dL (calc) — ABNORMAL HIGH
Non-HDL Cholesterol (Calc): 167 mg/dL (calc) — ABNORMAL HIGH (ref <130)
Total CHOL/HDL Ratio: 5.4 (calc) — ABNORMAL HIGH (ref <5.0)
Triglycerides: 220 mg/dL — ABNORMAL HIGH (ref <150)

## 2021-08-27 ENCOUNTER — Other Ambulatory Visit: Payer: Self-pay | Admitting: Adult Health

## 2021-08-27 ENCOUNTER — Other Ambulatory Visit: Payer: Self-pay | Admitting: Nurse Practitioner

## 2021-08-27 DIAGNOSIS — I10 Essential (primary) hypertension: Secondary | ICD-10-CM

## 2021-08-27 DIAGNOSIS — K219 Gastro-esophageal reflux disease without esophagitis: Secondary | ICD-10-CM

## 2021-11-15 ENCOUNTER — Other Ambulatory Visit: Payer: Self-pay | Admitting: Nurse Practitioner

## 2021-11-15 DIAGNOSIS — K219 Gastro-esophageal reflux disease without esophagitis: Secondary | ICD-10-CM

## 2022-01-16 NOTE — Progress Notes (Signed)
Complete Physical  Assessment and Plan: Health Maintenance- Discussed STD testing, safe sex, alcohol and drug awareness, drinking and driving dangers, wearing a seat belt and general safety measures for young adult. Recommended wearing sunscreen daily for skin cancer risk reduction.   Encounter for routine adult health examination without abnormal findings  Due Yearly  Essential hypertension Continue medication: olmesartan 40 mg daily and HCTZ 25 mg QD Monitor blood pressure at home; call if consistently over 130/80 Continue DASH diet.   Reminder to go to the ER if any CP, SOB, nausea, dizziness, severe HA, changes vision/speech, left arm numbness and tingling and jaw pain. -     CBC with Differential/Platelet -     COMPLETE METABOLIC PANEL WITH GFR -     Magnesium -     Urinalysis, Routine w reflex microscopic   Non-seasonal allergic rhinitis due to other allergic trigger Continue OTC allergy pills, nasal sprays, avoid triggers  Gastroesophageal reflux disease, esophagitis presence not specified Well managed on current medications, wasn't well controlled on H2i Discussed diet, avoiding triggers and other lifestyle changes -     Magnesium  Vitamin D deficiency -     VITAMIN D 25 Hydroxy (Vit-D Deficiency, Fractures)  Obesity (BMI 30.0-34.9) Long discussion about weight loss, diet, and exercise Recommended diet heavy in fruits and veggies and low in animal meats, cheeses, and dairy products, appropriate calorie intake Patient will work on reducing intake when working less, cut back on sugar/flour, increase fruits/veggies (aim to eat at least a serving with each meal) Discussed appropriate weight for height and initial goal (<215 lb) Follow up at next visit  Mixed hyperlipidemia Will begin Rosuvastatin if remains elevated Continue low cholesterol diet and exercise; advised to increase fiber intake Check lipid panel.  -     Lipid panel -     TSH  Family history of death due  to heart problem at 63 years of age or younger Aggressive risk factor management Control blood pressure, cholesterol, glucose, increase exercise, maintain weight Will refer for screening at age 51    Discussed med's effects and SE's. Screening labs and tests as requested with regular follow-up as recommended. Over 40 minutes of exam, counseling, chart review and critical decision making was performed  Future Appointments  Date Time Provider Department Center  01/22/2023 10:00 AM Revonda Humphrey, NP [facility] None    HPI  [name] presents for complete physical.has Hypertension; GERD (gastroesophageal reflux disease); Allergic rhinitis due to allergen; Vitamin D deficiency; Family history of death due to heart problem at 30 years of age or younger; Hyperlipidemia; and Obesity (BMI 30.0-34.9) on their problem list.   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   Uses astelin PRN allergies.   BMI is Body mass index is 33.5 kg/m., he was working on diet,  He has not been doing well with diet. He does drink sweet tea and soda.  He does not eat a lot of red meat.  He gets lots of exercise with his job.  Wt Readings from Last 3 Encounters:  01/21/22 247 lb (112 kg)  06/25/21 241 lb (109.3 kg)  03/07/21 239 lb (108.4 kg)   he has a diagnosis of GERD which is currently managed by omeprazole 40 mg daily he reports symptoms is currently well controlled, and denies breakthrough reflux, burning in chest, hoarseness or cough.  His blood pressure has been controlled at home (115- 120s over 65-70s), today their BP is BP: 112/82,   He does workout. He denies chest pain, shortness of breath, dizziness.   He is not on cholesterol medication, taking a cholesteol health OTC supplement [facility]). His LDL cholesterol is not at goal. The cholesterol last visit was:   Lab Results  Component Value  Date   CHOL 205 (H) 06/25/2021   HDL 38 (L) 06/25/2021   LDLCALC 130 (H) 06/25/2021   TRIG 220 (H) 06/25/2021   CHOLHDL 5.4 (H) 06/25/2021    He has been working on diet and exercise for glucose management. Last A1C in the office was:  Lab Results  Component Value Date   HGBA1C 5.1 01/18/2021   Lab Results  Component Value Date   GFRNONAA 119 01/18/2021   Patient is on Vitamin D supplement.   Lab Results  Component Value Date   VD25OH 54 01/18/2021       Current Medications:  Current Outpatient Medications on File Prior to Visit  Medication Sig Dispense Refill   aspirin 81 MG chewable tablet Chew 81 mg daily by mouth.     Cholecalciferol 5000 units TABS Take 5,000 Units by mouth daily.      hydrochlorothiazide (HYDRODIURIL) 25 MG tablet TAKE 1 TABLET BY MOUTH EVERY MORNING WITH BREAKFAST 90 tablet 1   olmesartan (BENICAR) 40 MG tablet TAKE 1 TABLET BY MOUTH DAILY FOR BLOOD PRESSURE 90 tablet 1   omeprazole (PRILOSEC) 40 MG capsule TAKE 1 CAPSULE BY MOUTH DAILY TO PREVENTHEARTBURN AND INDIGESTION 90 capsule 1   Phytosterol Esters (CHOLEST CARE PO) Take by mouth daily. Takes two tablets daily     azelastine (ASTELIN) 0.1 % nasal spray Use 1 to 2 Sprays each Nares 1 or 2 x /day (Patient not taking: Reported on 06/25/2021) 90 mL 3   No current facility-administered medications on file prior to visit.   Health Maintenance:    There is no immunization history on file for this patient.  TD/TDAP: 2017 Influenza: declines Pneumovax: n/a HPV vaccines: Had as a teen, 2/2 Covid 19: declines  Sexually Active: yes STD testing offered, declines Last Dental Exam: goes q 6 months, last 2021 Last Eye Exam: Wears contacts, goes annually 2021, will schedule this year  Allergies: No Known Allergies Medical History:  has Hypertension; GERD (gastroesophageal reflux disease); Allergic rhinitis due to allergen; Vitamin D deficiency; Family history of death due to heart problem at 53 years of  age or younger; Hyperlipidemia; and Obesity (BMI 30.0-34.9) on their problem list. Surgical History:  He  has a past surgical history that includes Wisdom tooth extraction (2014). Family History:  Hisfamily history includes Diabetes in his maternal grandmother; Heart attack (age of onset: 3) in his paternal uncle; Heart attack (age of onset: 69) in his paternal grandfather; Heart failure (age of onset: 42) in his paternal grandmother; Hypertension in his father; Hypothyroidism in his mother. Social History:   reports that he has never smoked. His smokeless tobacco use includes chew. He reports that he does not drink alcohol and does not use drugs.  Review of Systems: Review of Systems  Constitutional:  Negative for malaise/fatigue and weight loss.  HENT:  Negative for hearing loss and tinnitus.   Eyes:  Negative for blurred vision and double vision.  Respiratory:  Negative for cough, sputum production, shortness of breath and wheezing.   Cardiovascular:  Negative for chest pain, palpitations, orthopnea, claudication, leg swelling and PND.  Gastrointestinal:  Negative for abdominal pain, blood in stool, constipation, diarrhea, heartburn, melena, nausea and vomiting.  Genitourinary: Negative.   Musculoskeletal:  Negative for falls, joint pain and myalgias.  Skin:  Negative for rash.  Neurological:  Negative for dizziness, tingling, sensory change, weakness and headaches.  Endo/Heme/Allergies:  Negative for polydipsia.  Psychiatric/Behavioral: Negative.  Negative for depression, memory loss, substance abuse and suicidal ideas. The patient is not nervous/anxious and does not have insomnia.   All other systems reviewed and are negative.  Physical Exam: Estimated body mass index is 33.5 kg/m as calculated from the following:   Height as of this encounter: 6' (1.829 m).   Weight as of this encounter: 247 lb (112 kg). BP 112/82    Pulse 74    Temp 97.9 F (36.6 C)    Ht 6' (1.829 m)    Wt 247  lb (112 kg)    SpO2 98%    BMI 33.50 kg/m  General Appearance: Well nourished, in no apparent distress.  Eyes: PERRLA, EOMs, conjunctiva no swelling or erythema Sinuses: No Frontal/maxillary tenderness  ENT/Mouth: Ext aud canals clear, normal light reflex with TMs without erythema, bulging. Good dentition. No erythema, swelling, or exudate on post pharynx. Tonsils not swollen or erythematous. Hearing normal.  Neck: Supple, thyroid normal. No bruits  Respiratory: Respiratory effort normal, BS equal bilaterally without rales, rhonchi, wheezing or stridor.  Cardio: RRR without murmurs, rubs or gallops. Brisk peripheral pulses without edema.  Chest: symmetric, with normal excursions and percussion.  Abdomen: Soft, nontender, no guarding, rebound, hernias, masses, or organomegaly.  Lymphatics: Non tender without lymphadenopathy.  Genitourinary: defer, no issues Musculoskeletal: Full ROM all peripheral extremities,5/5 strength, and normal gait.  Skin: Warm, dry without rashes, lesions, ecchymosis. Neuro: Cranial nerves intact, reflexes equal bilaterally. Normal muscle tone, no cerebellar symptoms. Sensation intact.  Psych: Awake and oriented X 3, normal affect, Insight and Judgment appropriate.   EKG:  defer to next year  Jaime Little 10:13 AM [facility]

## 2022-01-21 ENCOUNTER — Ambulatory Visit (INDEPENDENT_AMBULATORY_CARE_PROVIDER_SITE_OTHER): Payer: Managed Care, Other (non HMO) | Admitting: Nurse Practitioner

## 2022-01-21 ENCOUNTER — Encounter: Payer: Self-pay | Admitting: Nurse Practitioner

## 2022-01-21 ENCOUNTER — Other Ambulatory Visit: Payer: Self-pay

## 2022-01-21 VITALS — BP 112/82 | HR 74 | Temp 97.9°F | Ht 72.0 in | Wt 247.0 lb

## 2022-01-21 DIAGNOSIS — Z131 Encounter for screening for diabetes mellitus: Secondary | ICD-10-CM

## 2022-01-21 DIAGNOSIS — Z0001 Encounter for general adult medical examination with abnormal findings: Secondary | ICD-10-CM

## 2022-01-21 DIAGNOSIS — Z0000 Encounter for general adult medical examination without abnormal findings: Secondary | ICD-10-CM | POA: Diagnosis not present

## 2022-01-21 DIAGNOSIS — Z79899 Other long term (current) drug therapy: Secondary | ICD-10-CM

## 2022-01-21 DIAGNOSIS — Z1329 Encounter for screening for other suspected endocrine disorder: Secondary | ICD-10-CM

## 2022-01-21 DIAGNOSIS — E559 Vitamin D deficiency, unspecified: Secondary | ICD-10-CM | POA: Diagnosis not present

## 2022-01-21 DIAGNOSIS — E669 Obesity, unspecified: Secondary | ICD-10-CM

## 2022-01-21 DIAGNOSIS — Z13220 Encounter for screening for lipoid disorders: Secondary | ICD-10-CM

## 2022-01-21 DIAGNOSIS — E782 Mixed hyperlipidemia: Secondary | ICD-10-CM

## 2022-01-21 DIAGNOSIS — Z1389 Encounter for screening for other disorder: Secondary | ICD-10-CM | POA: Diagnosis not present

## 2022-01-21 DIAGNOSIS — K219 Gastro-esophageal reflux disease without esophagitis: Secondary | ICD-10-CM

## 2022-01-21 DIAGNOSIS — I10 Essential (primary) hypertension: Secondary | ICD-10-CM

## 2022-01-21 DIAGNOSIS — J3089 Other allergic rhinitis: Secondary | ICD-10-CM

## 2022-01-22 ENCOUNTER — Other Ambulatory Visit: Payer: Self-pay | Admitting: Nurse Practitioner

## 2022-01-22 DIAGNOSIS — R7989 Other specified abnormal findings of blood chemistry: Secondary | ICD-10-CM

## 2022-01-22 LAB — CBC WITH DIFFERENTIAL/PLATELET
Absolute Monocytes: 569 cells/uL (ref 200–950)
Basophils Absolute: 102 cells/uL (ref 0–200)
Basophils Relative: 1.4 %
Eosinophils Absolute: 88 cells/uL (ref 15–500)
Eosinophils Relative: 1.2 %
HCT: 45.1 % (ref 38.5–50.0)
Hemoglobin: 15.2 g/dL (ref 13.2–17.1)
Lymphs Abs: 2037 cells/uL (ref 850–3900)
MCH: 28.3 pg (ref 27.0–33.0)
MCHC: 33.7 g/dL (ref 32.0–36.0)
MCV: 84 fL (ref 80.0–100.0)
MPV: 10.9 fL (ref 7.5–12.5)
Monocytes Relative: 7.8 %
Neutro Abs: 4504 cells/uL (ref 1500–7800)
Neutrophils Relative %: 61.7 %
Platelets: 252 10*3/uL (ref 140–400)
RBC: 5.37 10*6/uL (ref 4.20–5.80)
RDW: 13 % (ref 11.0–15.0)
Total Lymphocyte: 27.9 %
WBC: 7.3 10*3/uL (ref 3.8–10.8)

## 2022-01-22 LAB — COMPLETE METABOLIC PANEL WITH GFR
AG Ratio: 1.5 (calc) (ref 1.0–2.5)
ALT: 61 U/L — ABNORMAL HIGH (ref 9–46)
AST: 34 U/L (ref 10–40)
Albumin: 4.6 g/dL (ref 3.6–5.1)
Alkaline phosphatase (APISO): 61 U/L (ref 36–130)
BUN: 10 mg/dL (ref 7–25)
CO2: 28 mmol/L (ref 20–32)
Calcium: 10 mg/dL (ref 8.6–10.3)
Chloride: 101 mmol/L (ref 98–110)
Creat: 0.87 mg/dL (ref 0.60–1.24)
Globulin: 3.1 g/dL (calc) (ref 1.9–3.7)
Glucose, Bld: 90 mg/dL (ref 65–99)
Potassium: 3.9 mmol/L (ref 3.5–5.3)
Sodium: 140 mmol/L (ref 135–146)
Total Bilirubin: 0.7 mg/dL (ref 0.2–1.2)
Total Protein: 7.7 g/dL (ref 6.1–8.1)
eGFR: 121 mL/min/{1.73_m2} (ref >=60)

## 2022-01-22 LAB — URINALYSIS, ROUTINE W REFLEX MICROSCOPIC
Bilirubin Urine: NEGATIVE
Glucose, UA: NEGATIVE
Hgb urine dipstick: NEGATIVE
Ketones, ur: NEGATIVE
Leukocytes,Ua: NEGATIVE
Nitrite: NEGATIVE
Protein, ur: NEGATIVE
Specific Gravity, Urine: 1.013 (ref 1.001–1.035)
pH: 6.5 (ref 5.0–8.0)

## 2022-01-22 LAB — LIPID PANEL
Cholesterol: 203 mg/dL — ABNORMAL HIGH (ref <200)
HDL: 41 mg/dL (ref >=40)
LDL Cholesterol (Calc): 119 mg/dL (calc) — ABNORMAL HIGH
Non-HDL Cholesterol (Calc): 162 mg/dL (calc) — ABNORMAL HIGH (ref <130)
Total CHOL/HDL Ratio: 5 (calc) — ABNORMAL HIGH (ref <5.0)
Triglycerides: 299 mg/dL — ABNORMAL HIGH (ref <150)

## 2022-01-22 LAB — MAGNESIUM: Magnesium: 2.1 mg/dL (ref 1.5–2.5)

## 2022-01-22 LAB — HEMOGLOBIN A1C
Hgb A1c MFr Bld: 5.2 % of total Hgb (ref <5.7)
Mean Plasma Glucose: 103 mg/dL
eAG (mmol/L): 5.7 mmol/L

## 2022-01-22 LAB — TSH: TSH: 2.2 mIU/L (ref 0.40–4.50)

## 2022-01-22 LAB — VITAMIN D 25 HYDROXY (VIT D DEFICIENCY, FRACTURES): Vit D, 25-Hydroxy: 56 ng/mL (ref 30–100)

## 2022-01-23 ENCOUNTER — Telehealth: Payer: Self-pay | Admitting: Nurse Practitioner

## 2022-01-23 ENCOUNTER — Other Ambulatory Visit: Payer: Self-pay | Admitting: Nurse Practitioner

## 2022-01-23 DIAGNOSIS — E782 Mixed hyperlipidemia: Secondary | ICD-10-CM

## 2022-01-23 MED ORDER — ROSUVASTATIN CALCIUM 5 MG PO TABS: 5.0000 mg | ORAL_TABLET | Freq: Every day | ORAL | 11 refills | Status: DC

## 2022-01-23 NOTE — Telephone Encounter (Signed)
I sent script to his pharmacy

## 2022-01-23 NOTE — Telephone Encounter (Signed)
Pt said on his mychart Annabelle Harman said she was going to send in Rosuvastatin for him but hasn't received it yet.  ?

## 2022-03-11 ENCOUNTER — Other Ambulatory Visit: Payer: Self-pay

## 2022-03-11 DIAGNOSIS — I10 Essential (primary) hypertension: Secondary | ICD-10-CM

## 2022-03-11 MED ORDER — OLMESARTAN MEDOXOMIL 40 MG PO TABS: 40.0000 mg | ORAL_TABLET | Freq: Every day | ORAL | 1 refills | Status: DC

## 2022-04-22 NOTE — Progress Notes (Unsigned)
FOLLOW UP  Assessment and Plan:   Hypertension Fairly controlled with current medications; consider adding HCTZ if diastolic persistently elevated in office  Monitor blood pressure at home; patient to call if consistently greater than 130/80 Continue DASH diet.   Reminder to go to the ER if any CP, SOB, nausea, dizziness, severe HA, changes vision/speech, left arm numbness and tingling and jaw pain. -CBC  Cholesterol Mild elevations fairly controlled by lifestyle  Continue low cholesterol diet and exercise.  Check lipid panel, CMP  Obesity with co morbidities Commended progress with weight loss- Long discussion about weight loss, diet, and exercise Recommended diet heavy in fruits and veggies and low in animal meats, cheeses, and dairy products, appropriate calorie intake Discussed ideal weight for height  Patient will work on watching portions, cholesterol restricted diet, limiting sugar intake Will follow up in 3 months  Vitamin D Def At goal at last visit; continue supplementation to maintain goal of 60-100 Defer Vit D level  Elevated LFT's - CMP  Medication Management - Magnesium  Continue diet and meds as discussed. Further disposition pending results of labs. Discussed med's effects and SE's.   Over 30 minutes of exam, counseling, chart review, and critical decision making was performed.   Future Appointments  Date Time Provider [place]  08/06/2022  9:30 AM Sharlotte Alamo, NP [facility] None  01/22/2023 10:00 AM Sharlotte Alamo, NP [facility] None    ----------------------------------------------------------------------------------------------------------------------  HPI 31 y.o. male  presents for 6 month follow up on hypertension, cholesterol, weight and vitamin D deficiency.   BMI is Body mass index is 33.77 kg/m., he has been working on diet and exercise, working more during the summer. He works outside. Does a lot of snacking He is doing 1  soda per day  Wt Readings from Last 3 Encounters:  04/23/22 249 lb (112.9 kg)  01/21/22 247 lb (112 kg)  06/25/21 241 lb (109.3 kg)   His blood pressure has been controlled at home (120/70s), today their BP is BP: 106/76 . Currently on Olmesartan 40 mg daily BP Readings from Last 3 Encounters:  04/23/22 106/76  01/21/22 112/82  06/25/21 122/88   He does workout. He denies chest pain, shortness of breath, dizziness.    He is on cholesterol medication, Rosuvastatin 5 mg and denies myalgias. His cholesterol is not at goal. The cholesterol last visit was:   Lab Results  Component Value Date   CHOL 203 (H) 01/21/2022   HDL 41 01/21/2022   LDLCALC 119 (H) 01/21/2022   TRIG 299 (H) 01/21/2022   CHOLHDL 5.0 (H) 01/21/2022   Last A1C in the office was:  Lab Results  Component Value Date   HGBA1C 5.2 01/21/2022   Patient is on Vitamin D supplement.   Lab Results  Component Value Date   VD25OH 56 01/21/2022       Current Medications:  Current Outpatient Medications on File Prior to Visit  Medication Sig   aspirin 81 MG chewable tablet Chew 81 mg daily by mouth.   Cholecalciferol 5000 units TABS Take 5,000 Units by mouth daily.    hydrochlorothiazide (HYDRODIURIL) 25 MG tablet TAKE 1 TABLET BY MOUTH EVERY MORNING WITH BREAKFAST   olmesartan (BENICAR) 40 MG tablet Take 1 tablet (40 mg total) by mouth daily. for blood pressure   omeprazole (PRILOSEC) 40 MG capsule TAKE 1 CAPSULE BY MOUTH DAILY TO PREVENTHEARTBURN AND INDIGESTION   Phytosterol Esters (CHOLEST CARE PO) Take by mouth daily. Takes two tablets daily  rosuvastatin (CRESTOR) 5 MG tablet Take 1 tablet (5 mg total) by mouth daily.   azelastine (ASTELIN) 0.1 % nasal spray Use 1 to 2 Sprays each Nares 1 or 2 x /day (Patient not taking: Reported on 06/25/2021)   No current facility-administered medications on file prior to visit.     Allergies: No Known Allergies   Medical History:  Past Medical History:  Diagnosis Date    GERD without esophagitis    Hypertension    Family history- Reviewed and unchanged Social history- Reviewed and unchanged   Review of Systems:  Review of Systems  Constitutional:  Negative for malaise/fatigue and weight loss.  HENT:  Negative for hearing loss and tinnitus.   Eyes:  Negative for blurred vision and double vision.  Respiratory:  Negative for cough, shortness of breath and wheezing.   Cardiovascular:  Negative for chest pain, palpitations, orthopnea, claudication and leg swelling.  Gastrointestinal:  Negative for abdominal pain, blood in stool, constipation, diarrhea, heartburn, melena, nausea and vomiting.  Genitourinary: Negative.   Musculoskeletal:  Negative for joint pain and myalgias.  Skin:  Negative for rash.  Neurological:  Negative for dizziness, tingling, sensory change, weakness and headaches.  Endo/Heme/Allergies:  Negative for polydipsia.  Psychiatric/Behavioral: Negative.    All other systems reviewed and are negative.   Physical Exam: BP 106/76   Pulse 75   Temp 97.7 F (36.5 C)   Wt 249 lb (112.9 kg)   SpO2 95%   BMI 33.77 kg/m  Wt Readings from Last 3 Encounters:  04/23/22 249 lb (112.9 kg)  01/21/22 247 lb (112 kg)  06/25/21 241 lb (109.3 kg)   General Appearance: Well nourished, in no apparent distress. Eyes: PERRLA, EOMs, conjunctiva no swelling or erythema Sinuses: No Frontal/maxillary tenderness ENT/Mouth: Ext aud canals clear, TMs without erythema, bulging. No erythema, swelling, or exudate on post pharynx.  Tonsils not swollen or erythematous. Hearing normal.  Neck: Supple, thyroid normal.  Respiratory: Respiratory effort normal, BS equal bilaterally without rales, rhonchi, wheezing or stridor.  Cardio: RRR with no MRGs. Brisk peripheral pulses without edema.  Abdomen: Soft, + BS.  Non tender, no guarding, rebound, hernias, masses. Lymphatics: Non tender without lymphadenopathy.  Musculoskeletal: Full ROM, 5/5 strength, Normal  gait Skin: Warm, dry without rashes, lesions, ecchymosis.  Neuro: Cranial nerves intact. No cerebellar symptoms.  Psych: Awake and oriented X 3, normal affect, Insight and Judgment appropriate.    Sharlotte Alamo, NP 9:17 AM [facility]

## 2022-04-23 ENCOUNTER — Ambulatory Visit (INDEPENDENT_AMBULATORY_CARE_PROVIDER_SITE_OTHER): Payer: Commercial Managed Care - HMO | Admitting: Nurse Practitioner

## 2022-04-23 ENCOUNTER — Encounter: Payer: Self-pay | Admitting: Nurse Practitioner

## 2022-04-23 VITALS — BP 106/76 | HR 75 | Temp 97.7°F | Wt 249.0 lb

## 2022-04-23 DIAGNOSIS — E66811 Obesity, class 1: Secondary | ICD-10-CM

## 2022-04-23 DIAGNOSIS — E782 Mixed hyperlipidemia: Secondary | ICD-10-CM

## 2022-04-23 DIAGNOSIS — Z79899 Other long term (current) drug therapy: Secondary | ICD-10-CM

## 2022-04-23 DIAGNOSIS — K219 Gastro-esophageal reflux disease without esophagitis: Secondary | ICD-10-CM

## 2022-04-23 DIAGNOSIS — E669 Obesity, unspecified: Secondary | ICD-10-CM | POA: Diagnosis not present

## 2022-04-23 DIAGNOSIS — R7989 Other specified abnormal findings of blood chemistry: Secondary | ICD-10-CM

## 2022-04-23 DIAGNOSIS — I10 Essential (primary) hypertension: Secondary | ICD-10-CM | POA: Diagnosis not present

## 2022-04-23 DIAGNOSIS — E559 Vitamin D deficiency, unspecified: Secondary | ICD-10-CM

## 2022-04-23 MED ORDER — ROSUVASTATIN CALCIUM 5 MG PO TABS: 5.0000 mg | ORAL_TABLET | Freq: Every day | ORAL | 11 refills | Status: DC

## 2022-04-24 ENCOUNTER — Other Ambulatory Visit: Payer: Self-pay | Admitting: Nurse Practitioner

## 2022-04-24 DIAGNOSIS — R7989 Other specified abnormal findings of blood chemistry: Secondary | ICD-10-CM

## 2022-04-24 LAB — CBC WITH DIFFERENTIAL/PLATELET
Absolute Monocytes: 574 cells/uL (ref 200–950)
Basophils Absolute: 77 cells/uL (ref 0–200)
Basophils Relative: 1.1 %
Eosinophils Absolute: 77 cells/uL (ref 15–500)
Eosinophils Relative: 1.1 %
HCT: 45.2 % (ref 38.5–50.0)
Hemoglobin: 15.1 g/dL (ref 13.2–17.1)
Lymphs Abs: 1974 cells/uL (ref 850–3900)
MCH: 28.7 pg (ref 27.0–33.0)
MCHC: 33.4 g/dL (ref 32.0–36.0)
MCV: 85.9 fL (ref 80.0–100.0)
MPV: 10.3 fL (ref 7.5–12.5)
Monocytes Relative: 8.2 %
Neutro Abs: 4298 cells/uL (ref 1500–7800)
Neutrophils Relative %: 61.4 %
Platelets: 234 10*3/uL (ref 140–400)
RBC: 5.26 10*6/uL (ref 4.20–5.80)
RDW: 13 % (ref 11.0–15.0)
Total Lymphocyte: 28.2 %
WBC: 7 10*3/uL (ref 3.8–10.8)

## 2022-04-24 LAB — COMPLETE METABOLIC PANEL WITH GFR
AG Ratio: 1.6 (calc) (ref 1.0–2.5)
ALT: 69 U/L — ABNORMAL HIGH (ref 9–46)
AST: 43 U/L — ABNORMAL HIGH (ref 10–40)
Albumin: 4.6 g/dL (ref 3.6–5.1)
Alkaline phosphatase (APISO): 65 U/L (ref 36–130)
BUN: 11 mg/dL (ref 7–25)
CO2: 31 mmol/L (ref 20–32)
Calcium: 10 mg/dL (ref 8.6–10.3)
Chloride: 101 mmol/L (ref 98–110)
Creat: 0.94 mg/dL (ref 0.60–1.24)
Globulin: 2.8 g/dL (calc) (ref 1.9–3.7)
Glucose, Bld: 94 mg/dL (ref 65–99)
Potassium: 3.6 mmol/L (ref 3.5–5.3)
Sodium: 141 mmol/L (ref 135–146)
Total Bilirubin: 0.9 mg/dL (ref 0.2–1.2)
Total Protein: 7.4 g/dL (ref 6.1–8.1)
eGFR: 113 mL/min/{1.73_m2} (ref >=60)

## 2022-04-24 LAB — LIPID PANEL
Cholesterol: 160 mg/dL (ref <200)
HDL: 42 mg/dL (ref >=40)
LDL Cholesterol (Calc): 78 mg/dL (calc)
Non-HDL Cholesterol (Calc): 118 mg/dL (calc) (ref <130)
Total CHOL/HDL Ratio: 3.8 (calc) (ref <5.0)
Triglycerides: 330 mg/dL — ABNORMAL HIGH (ref <150)

## 2022-04-24 LAB — MAGNESIUM: Magnesium: 2.1 mg/dL (ref 1.5–2.5)

## 2022-04-26 ENCOUNTER — Ambulatory Visit
Admission: RE | Admit: 2022-04-26 | Discharge: 2022-04-26 | Disposition: A | Discharge status: Home / Self Care | Payer: Commercial Managed Care - HMO | Source: Ambulatory Visit | Attending: Nurse Practitioner | Admitting: Nurse Practitioner

## 2022-04-26 DIAGNOSIS — R7989 Other specified abnormal findings of blood chemistry: Secondary | ICD-10-CM

## 2022-05-28 ENCOUNTER — Other Ambulatory Visit: Payer: Self-pay | Admitting: Adult Health

## 2022-05-28 DIAGNOSIS — K219 Gastro-esophageal reflux disease without esophagitis: Secondary | ICD-10-CM

## 2022-07-24 ENCOUNTER — Ambulatory Visit: Payer: Commercial Managed Care - HMO | Admitting: Nurse Practitioner

## 2022-08-06 ENCOUNTER — Ambulatory Visit: Payer: Commercial Managed Care - HMO | Admitting: Nurse Practitioner

## 2022-08-15 ENCOUNTER — Other Ambulatory Visit: Payer: Self-pay | Admitting: Nurse Practitioner

## 2022-08-15 DIAGNOSIS — I10 Essential (primary) hypertension: Secondary | ICD-10-CM

## 2022-08-21 NOTE — Progress Notes (Signed)
FOLLOW UP  Assessment and Plan:   Hypertension Fairly controlled with current medications Monitor blood pressure at home; patient to call if consistently greater than 130/80 Continue DASH diet.   Reminder to go to the ER if any CP, SOB, nausea, dizziness, severe HA, changes vision/speech, left arm numbness and tingling and jaw pain. -CBC  Cholesterol Mild elevations fairly controlled by lifestyle  Continue low cholesterol diet and exercise.  Check lipid panel, CMP  Obesity with co morbidities Commended progress with weight loss- Long discussion about weight loss, diet, and exercise Recommended diet heavy in fruits and veggies and low in animal meats, cheeses, and dairy products, appropriate calorie intake Discussed ideal weight for height  Patient will work on watching portions, cholesterol restricted diet, limiting sugar intake Will follow up in 3 months  Vitamin D Def At goal at last visit; continue supplementation to maintain goal of 60-100 Defer Vit D level  Elevated LFT's - CMP  Medication Management - Magnesium  Intermittent reactive airway disease - Albuterol as needed Monitor symptoms and use antihistamines and steroid nasal spray as needed  Continue diet and meds as discussed. Further disposition pending results of labs. Discussed med's effects and SE's.   Over 30 minutes of exam, counseling, chart review, and critical decision making was performed.   Future Appointments  Date Time Provider [place]  08/23/2022  9:45 AM Alycia Rossetti, NP [facility] None  10/23/2022  9:30 AM Alycia Rossetti, NP [facility] None  01/22/2023 10:00 AM Alycia Rossetti, NP [facility] None    ----------------------------------------------------------------------------------------------------------------------  HPI 31 y.o. male  presents for 6 month follow up on hypertension, cholesterol, weight and vitamin D deficiency.   During the summer when he gets very hot  when he is working he will get wheezing and shortness of breath.   BMI is Body mass index is 34.39 kg/m., he has been working on diet and exercise, working more during the summer. He works outside. Does a lot of snacking He is exercising and eating smaller portions He is doing 1 soda per day  Wt Readings from Last 3 Encounters:  08/23/22 253 lb 9.6 oz (115 kg)  04/23/22 249 lb (112.9 kg)  01/21/22 247 lb (112 kg)   His blood pressure has been controlled at home (120/70s), today their BP is BP: 120/78 . Currently on Olmesartan 40 mg daily BP Readings from Last 3 Encounters:  08/23/22 120/78  04/23/22 106/76  01/21/22 112/82   He does workout. He denies chest pain, shortness of breath, dizziness.    He is on cholesterol medication, Rosuvastatin 5 mg and denies myalgias. His cholesterol is not at goal. The cholesterol last visit was:   Lab Results  Component Value Date   CHOL 160 04/23/2022   HDL 42 04/23/2022   LDLCALC 78 04/23/2022   TRIG 330 (H) 04/23/2022   CHOLHDL 3.8 04/23/2022   Last A1C in the office was:  Lab Results  Component Value Date   HGBA1C 5.2 01/21/2022   Patient is on Vitamin D supplement.   Lab Results  Component Value Date   VD25OH 56 01/21/2022       Current Medications:  Current Outpatient Medications on File Prior to Visit  Medication Sig   aspirin 81 MG chewable tablet Chew 81 mg daily by mouth.   Cholecalciferol 5000 units TABS Take 5,000 Units by mouth daily.    hydrochlorothiazide (HYDRODIURIL) 25 MG tablet TAKE 1 TABLET BY MOUTH EVERY MORNING WITH BREAKFAST   olmesartan (  BENICAR) 40 MG tablet TAKE 1 TABLET BY MOUTH DAILY FOR BLOOD PRESSURE   omeprazole (PRILOSEC) 40 MG capsule TAKE 1 CAPSULE BY MOUTH DAILY TO PREVENTHEARTBURN AND INDIGESTION   Phytosterol Esters (CHOLEST CARE PO) Take by mouth daily. Takes two tablets daily   rosuvastatin (CRESTOR) 5 MG tablet Take 1 tablet (5 mg total) by mouth daily.   No current facility-administered  medications on file prior to visit.     Allergies: No Known Allergies   Medical History:  Past Medical History:  Diagnosis Date   GERD without esophagitis    Hypertension    Family history- Reviewed and unchanged Social history- Reviewed and unchanged   Review of Systems:  Review of Systems  Constitutional:  Negative for malaise/fatigue and weight loss.  HENT:  Negative for hearing loss and tinnitus.   Eyes:  Negative for blurred vision and double vision.  Respiratory:  Negative for cough, shortness of breath and wheezing.   Cardiovascular:  Negative for chest pain, palpitations, orthopnea, claudication and leg swelling.  Gastrointestinal:  Negative for abdominal pain, blood in stool, constipation, diarrhea, heartburn, melena, nausea and vomiting.  Genitourinary: Negative.   Musculoskeletal:  Negative for joint pain and myalgias.  Skin:  Negative for rash.  Neurological:  Negative for dizziness, tingling, sensory change, weakness and headaches.  Endo/Heme/Allergies:  Negative for polydipsia.  Psychiatric/Behavioral: Negative.    All other systems reviewed and are negative.    Physical Exam: BP 120/78   Pulse 86   Temp 97.9 F (36.6 C)   Resp 16   Ht 6' (1.829 m)   Wt 253 lb 9.6 oz (115 kg)   SpO2 97%   BMI 34.39 kg/m  Wt Readings from Last 3 Encounters:  08/23/22 253 lb 9.6 oz (115 kg)  04/23/22 249 lb (112.9 kg)  01/21/22 247 lb (112 kg)   General Appearance: Well nourished, in no apparent distress. Eyes: PERRLA, EOMs, conjunctiva no swelling or erythema Sinuses: No Frontal/maxillary tenderness ENT/Mouth: Ext aud canals clear, TMs without erythema, bulging. No erythema, swelling, or exudate on post pharynx.  Tonsils not swollen or erythematous. Hearing normal.  Neck: Supple, thyroid normal.  Respiratory: Respiratory effort normal, BS equal bilaterally without rales, rhonchi, wheezing or stridor.  Cardio: RRR with no MRGs. Brisk peripheral pulses without edema.   Abdomen: Soft, + BS.  Non tender, no guarding, rebound, hernias, masses. Lymphatics: Non tender without lymphadenopathy.  Musculoskeletal: Full ROM, 5/5 strength, Normal gait Skin: Warm, dry without rashes, lesions, ecchymosis.  Neuro: Cranial nerves intact. No cerebellar symptoms.  Psych: Awake and oriented X 3, normal affect, Insight and Judgment appropriate.    Raynelle Dick, NP 9:30 AM [facility]

## 2022-08-23 ENCOUNTER — Encounter: Payer: Self-pay | Admitting: Nurse Practitioner

## 2022-08-23 ENCOUNTER — Ambulatory Visit (INDEPENDENT_AMBULATORY_CARE_PROVIDER_SITE_OTHER): Payer: Commercial Managed Care - HMO | Admitting: Nurse Practitioner

## 2022-08-23 VITALS — BP 120/78 | HR 86 | Temp 97.9°F | Resp 16 | Ht 72.0 in | Wt 253.6 lb

## 2022-08-23 DIAGNOSIS — E782 Mixed hyperlipidemia: Secondary | ICD-10-CM | POA: Diagnosis not present

## 2022-08-23 DIAGNOSIS — Z79899 Other long term (current) drug therapy: Secondary | ICD-10-CM

## 2022-08-23 DIAGNOSIS — E669 Obesity, unspecified: Secondary | ICD-10-CM

## 2022-08-23 DIAGNOSIS — R7989 Other specified abnormal findings of blood chemistry: Secondary | ICD-10-CM

## 2022-08-23 DIAGNOSIS — E559 Vitamin D deficiency, unspecified: Secondary | ICD-10-CM

## 2022-08-23 DIAGNOSIS — I10 Essential (primary) hypertension: Secondary | ICD-10-CM | POA: Diagnosis not present

## 2022-08-23 DIAGNOSIS — J4520 Mild intermittent asthma, uncomplicated: Secondary | ICD-10-CM

## 2022-08-23 DIAGNOSIS — K760 Fatty (change of) liver, not elsewhere classified: Secondary | ICD-10-CM

## 2022-08-23 MED ORDER — ALBUTEROL SULFATE HFA 108 (90 BASE) MCG/ACT IN AERS: 2.0000 | INHALATION_SPRAY | Freq: Four times a day (QID) | RESPIRATORY_TRACT | 2 refills | Status: AC | PRN

## 2022-08-23 NOTE — Patient Instructions (Signed)
Use 2 puffs every 6 hours as needed for shortness of breath and wheezing  Albuterol Metered Dose Inhaler (MDI) What is this medication? ALBUTEROL (al [place]) treats lung diseases, such as asthma, where the airways in the lungs narrow, causing breathing problems or wheezing (bronchospasm). It is also used to treat asthma or prevent breathing problems during exercise. This medication works by opening the airways of the lungs, making it easier to breathe. It is often called a rescue- or quick-relief inhaler. This medicine may be used for other purposes; ask your health care provider or pharmacist if you have questions. COMMON BRAND NAME(S): Proair HFA, Proventil, Proventil HFA, Respirol, Ventolin, Ventolin HFA What should I tell my care team before I take this medication? They need to know if you have any of these conditions: Diabetes (high blood sugar) Heart disease High blood pressure Irregular heartbeat or rhythm Pheochromocytoma Seizures Thyroid disease An unusual or allergic reaction to albuterol, other medications, foods, dyes, or preservatives Pregnant or trying to get pregnant Breast-feeding How should I use this medication? This medication is inhaled through the mouth. Take it as directed on the prescription label. Do not use it more often than directed. This medication comes with INSTRUCTIONS FOR USE. Ask your pharmacist for directions on how to use this medication. Read the information carefully. Talk to your pharmacist or care team if you have questions. Talk to your care team about the use of this medication in children. While it may be given to children for selected conditions, precautions do apply. Overdosage: If you think you have taken too much of this medicine contact a poison control center or emergency room at once. NOTE: This medicine is only for you. Do not share this medicine with others. What if I miss a dose? If you take this medication on a regular basis, take  it as soon as you can. If it is almost time for your next dose, take only that dose. Do not take double or extra doses. What may interact with this medication? Certain medications for blood pressure, heart disease, irregular heartbeat Certain medications for depression, anxiety, or other mental health conditions Diuretics MAOIs, such as Marplan, Nardil, and Parnate This list may not describe all possible interactions. Give your health care provider a list of all the medicines, herbs, non-prescription drugs, or dietary supplements you use. Also tell them if you smoke, drink alcohol, or use illegal drugs. Some items may interact with your medicine. What should I watch for while using this medication? Visit your care team for regular checks on your progress. Tell your care team if your symptoms do not start to get better or if they get worse. If your symptoms get worse or if you are using this medication more than normal, call your care team right away. Do not treat yourself for coughs, colds or allergies without asking your care team for advice. Some nonprescription medications can affect this one. You and your care team should develop an Asthma Action Plan that is just for you. Be sure to know what to do if you are in the yellow (asthma is getting worse) or red (medical alert) zones. Your mouth may get dry. Chewing sugarless gum or sucking hard candy and drinking plenty of water may help. Contact your health care provider if the problem does not go away or is severe. What side effects may I notice from receiving this medication? Side effects that you should report to your care team as soon as possible: Allergic  reactions--skin rash, itching, hives, swelling of the face, lips, tongue, or throat Heart rhythm changes--fast or irregular heartbeat, dizziness, feeling faint or lightheaded, chest pain, trouble breathing Increase in blood pressure Muscle pain or cramps Wheezing or trouble breathing that is  worse after use Side effects that usually do not require medical attention (report to your care team if they continue or are bothersome): Change in taste Dry mouth Headache Sore throat Tremors or shaking Trouble sleeping This list may not describe all possible side effects. Call your doctor for medical advice about side effects. You may report side effects to FDA at 1-800-FDA-1088. Where should I keep my medication? Keep out of the reach of children and pets. Store at room temperature between 20 and 25 degrees C (68 and 77 degrees F). Keep inhaler away from extreme heat and cold. Get rid of it when the dose counter reads 0 or after the expiration date, whichever is first. To get rid of medications that are no longer needed or have expired: Take the medication to a medication take-back program. Check with your pharmacy or law enforcement to find a location. If you cannot return the medication, ask your care team how to get rid of this medication safely. NOTE: This sheet is a summary. It may not cover all possible information. If you have questions about this medicine, talk to your doctor, pharmacist, or health care provider.  2023 Elsevier/Gold Standard (2020-10-19 00:00:00)

## 2022-08-24 LAB — CBC WITH DIFFERENTIAL/PLATELET
Absolute Monocytes: 606 cells/uL (ref 200–950)
Basophils Absolute: 88 cells/uL (ref 0–200)
Basophils Relative: 1.2 %
Eosinophils Absolute: 110 cells/uL (ref 15–500)
Eosinophils Relative: 1.5 %
HCT: 44.9 % (ref 38.5–50.0)
Hemoglobin: 15.3 g/dL (ref 13.2–17.1)
Lymphs Abs: 2124 cells/uL (ref 850–3900)
MCH: 29 pg (ref 27.0–33.0)
MCHC: 34.1 g/dL (ref 32.0–36.0)
MCV: 85.2 fL (ref 80.0–100.0)
MPV: 10.4 fL (ref 7.5–12.5)
Monocytes Relative: 8.3 %
Neutro Abs: 4373 cells/uL (ref 1500–7800)
Neutrophils Relative %: 59.9 %
Platelets: 287 10*3/uL (ref 140–400)
RBC: 5.27 10*6/uL (ref 4.20–5.80)
RDW: 12.7 % (ref 11.0–15.0)
Total Lymphocyte: 29.1 %
WBC: 7.3 10*3/uL (ref 3.8–10.8)

## 2022-08-24 LAB — VITAMIN D 25 HYDROXY (VIT D DEFICIENCY, FRACTURES): Vit D, 25-Hydroxy: 65 ng/mL (ref 30–100)

## 2022-08-24 LAB — COMPLETE METABOLIC PANEL WITH GFR
AG Ratio: 1.4 (calc) (ref 1.0–2.5)
ALT: 68 U/L — ABNORMAL HIGH (ref 9–46)
AST: 45 U/L — ABNORMAL HIGH (ref 10–40)
Albumin: 4.6 g/dL (ref 3.6–5.1)
Alkaline phosphatase (APISO): 68 U/L (ref 36–130)
BUN: 11 mg/dL (ref 7–25)
CO2: 32 mmol/L (ref 20–32)
Calcium: 10.2 mg/dL (ref 8.6–10.3)
Chloride: 100 mmol/L (ref 98–110)
Creat: 1.01 mg/dL (ref 0.60–1.24)
Globulin: 3.2 g/dL (calc) (ref 1.9–3.7)
Glucose, Bld: 92 mg/dL (ref 65–99)
Potassium: 3.7 mmol/L (ref 3.5–5.3)
Sodium: 141 mmol/L (ref 135–146)
Total Bilirubin: 0.7 mg/dL (ref 0.2–1.2)
Total Protein: 7.8 g/dL (ref 6.1–8.1)
eGFR: 103 mL/min/{1.73_m2} (ref >=60)

## 2022-08-24 LAB — LIPID PANEL
Cholesterol: 146 mg/dL (ref <200)
HDL: 36 mg/dL — ABNORMAL LOW (ref >=40)
LDL Cholesterol (Calc): 74 mg/dL (calc)
Non-HDL Cholesterol (Calc): 110 mg/dL (calc) (ref <130)
Total CHOL/HDL Ratio: 4.1 (calc) (ref <5.0)
Triglycerides: 273 mg/dL — ABNORMAL HIGH (ref <150)

## 2022-10-23 ENCOUNTER — Ambulatory Visit: Payer: Managed Care, Other (non HMO) | Admitting: Nurse Practitioner

## 2022-11-26 ENCOUNTER — Other Ambulatory Visit: Payer: Self-pay

## 2022-11-26 DIAGNOSIS — K219 Gastro-esophageal reflux disease without esophagitis: Secondary | ICD-10-CM

## 2022-11-26 MED ORDER — OMEPRAZOLE 40 MG PO CPDR: DELAYED_RELEASE_CAPSULE | ORAL | 1 refills | Status: DC

## 2022-11-26 MED ORDER — HYDROCHLOROTHIAZIDE 25 MG PO TABS: 25.0000 mg | ORAL_TABLET | Freq: Every day | ORAL | 1 refills | Status: DC

## 2022-12-03 NOTE — Progress Notes (Signed)
FOLLOW UP  Assessment and Plan:   Essential Hypertension Controlled on Olmesartan 40 mg and HCTZ 25 mg daily Monitor blood pressure at home; patient to call if consistently greater than 130/80 Continue DASH diet.   Reminder to go to the ER if any CP, SOB, nausea, dizziness, severe HA, changes vision/speech, left arm numbness and tingling and jaw pain. -CBC  Mixed Hyperlipidemia Mild elevations controlled with Rosuvastatin 5 mg daily  Continue low cholesterol diet and exercise.  Check lipid panel, CMP  Obesity with co morbidities Commended progress with weight loss- Long discussion about weight loss, diet, and exercise Patient will work on watching portions, decreasing saturated fats and simple carbs, limit alcohol and Tylenol Will follow up in 6 months  Vitamin D Def At goal at last visit; continue supplementation to maintain goal of 60-100 Defer Vit D level  Elevated LFT's/Hepatic steatosis Continue diet, exercise and limit Tylenol and alcohol - CMP  Medication Management - TSH  Continue diet and meds as discussed. Further disposition pending results of labs. Discussed med's effects and SE's.   Over 30 minutes of exam, counseling, chart review, and critical decision making was performed.   Future Appointments  Date Time Provider [place]  03/11/2023  9:00 AM Alycia Rossetti, NP [facility] None    ----------------------------------------------------------------------------------------------------------------------  HPI 32 y.o. male  presents for 6 month follow up on hypertension, cholesterol, weight and vitamin D deficiency.   BMI is Body mass index is 34.72 kg/m., he has been working on diet and exercise,  he works outside. He has not been following his diet as well.  Wt Readings from Last 3 Encounters:  12/04/22 256 lb (116.1 kg)  08/23/22 253 lb 9.6 oz (115 kg)  04/23/22 249 lb (112.9 kg)   His blood pressure has been controlled at home (120/70s),  today their BP is BP: 102/62 . Currently on Olmesartan 40 mg daily BP Readings from Last 3 Encounters:  12/04/22 102/62  08/23/22 120/78  04/23/22 106/76   He does workout. He denies chest pain, shortness of breath, dizziness.    He is on cholesterol medication, Rosuvastatin 5 mg and denies myalgias. His cholesterol is not at goal. The cholesterol last visit was:   Lab Results  Component Value Date   CHOL 146 08/23/2022   HDL 36 (L) 08/23/2022   LDLCALC 74 08/23/2022   TRIG 273 (H) 08/23/2022   CHOLHDL 4.1 08/23/2022   Last A1C in the office was:  Lab Results  Component Value Date   HGBA1C 5.2 01/21/2022   Patient is on Vitamin D supplement.   Lab Results  Component Value Date   VD25OH 65 08/23/2022     He had U/S 04/26/22 showing hepatic steatosis. Last LFT's were: Lab Results  Component Value Date   ALT 68 (H) 08/23/2022   AST 45 (H) 08/23/2022   BILITOT 0.7 08/23/2022     Current Medications:  Current Outpatient Medications on File Prior to Visit  Medication Sig   albuterol (VENTOLIN HFA) 108 (90 Base) MCG/ACT inhaler Inhale 2 puffs into the lungs every 6 (six) hours as needed for wheezing or shortness of breath.   aspirin 81 MG chewable tablet Chew 81 mg daily by mouth.   Cholecalciferol 5000 units TABS Take 5,000 Units by mouth daily.    hydrochlorothiazide (HYDRODIURIL) 25 MG tablet Take 1 tablet (25 mg total) by mouth daily with breakfast.   Levocetirizine Dihydrochloride (XYZAL PO) Take by mouth.   olmesartan (BENICAR) 40 MG tablet TAKE  1 TABLET BY MOUTH DAILY FOR BLOOD PRESSURE   omeprazole (PRILOSEC) 40 MG capsule TAKE 1 CAPSULE BY MOUTH DAILY TO PREVENTHEARTBURN AND INDIGESTION   Phytosterol Esters (CHOLEST CARE PO) Take by mouth daily. Takes two tablets daily   rosuvastatin (CRESTOR) 5 MG tablet Take 1 tablet (5 mg total) by mouth daily.   No current facility-administered medications on file prior to visit.     Allergies: No Known Allergies   Medical  History:  Past Medical History:  Diagnosis Date   GERD without esophagitis    Hypertension    Family history- Reviewed and unchanged Social history- Reviewed and unchanged   Review of Systems:  Review of Systems  Constitutional:  Negative for malaise/fatigue and weight loss.  HENT:  Negative for hearing loss and tinnitus.   Eyes:  Negative for blurred vision and double vision.  Respiratory:  Negative for cough, shortness of breath and wheezing.   Cardiovascular:  Negative for chest pain, palpitations, orthopnea, claudication and leg swelling.  Gastrointestinal:  Negative for abdominal pain, blood in stool, constipation, diarrhea, heartburn, melena, nausea and vomiting.  Genitourinary: Negative.   Musculoskeletal:  Negative for joint pain and myalgias.  Skin:  Negative for rash.  Neurological:  Negative for dizziness, tingling, sensory change, weakness and headaches.  Endo/Heme/Allergies:  Negative for polydipsia.  Psychiatric/Behavioral: Negative.    All other systems reviewed and are negative.    Physical Exam: BP 102/62   Pulse 79   Temp 98.1 F (36.7 C)   Ht 6' (1.829 m)   Wt 256 lb (116.1 kg)   SpO2 96%   BMI 34.72 kg/m  Wt Readings from Last 3 Encounters:  12/04/22 256 lb (116.1 kg)  08/23/22 253 lb 9.6 oz (115 kg)  04/23/22 249 lb (112.9 kg)   General Appearance: Well nourished, in no apparent distress. Eyes: PERRLA, EOMs, conjunctiva no swelling or erythema Sinuses: No Frontal/maxillary tenderness ENT/Mouth: Ext aud canals clear, TMs without erythema, bulging. No erythema, swelling, or exudate on post pharynx.  Tonsils not swollen or erythematous. Hearing normal.  Neck: Supple, thyroid normal.  Respiratory: Respiratory effort normal, BS equal bilaterally without rales, rhonchi, wheezing or stridor.  Cardio: RRR with no MRGs. Brisk peripheral pulses without edema.  Abdomen: Soft, + BS.  Non tender, no guarding, rebound, hernias, masses. Lymphatics: Non tender  without lymphadenopathy.  Musculoskeletal: Full ROM, 5/5 strength, Normal gait Skin: Warm, dry without rashes, lesions, ecchymosis.  Neuro: Cranial nerves intact. No cerebellar symptoms.  Psych: Awake and oriented X 3, normal affect, Insight and Judgment appropriate.    Alycia Rossetti, NP 9:43 AM [facility]

## 2022-12-04 ENCOUNTER — Ambulatory Visit (INDEPENDENT_AMBULATORY_CARE_PROVIDER_SITE_OTHER): Payer: Commercial Managed Care - HMO | Admitting: Nurse Practitioner

## 2022-12-04 ENCOUNTER — Encounter: Payer: Self-pay | Admitting: Nurse Practitioner

## 2022-12-04 VITALS — BP 102/62 | HR 79 | Temp 98.1°F | Ht 72.0 in | Wt 256.0 lb

## 2022-12-04 DIAGNOSIS — E559 Vitamin D deficiency, unspecified: Secondary | ICD-10-CM

## 2022-12-04 DIAGNOSIS — Z79899 Other long term (current) drug therapy: Secondary | ICD-10-CM

## 2022-12-04 DIAGNOSIS — I10 Essential (primary) hypertension: Secondary | ICD-10-CM

## 2022-12-04 DIAGNOSIS — R7989 Other specified abnormal findings of blood chemistry: Secondary | ICD-10-CM | POA: Diagnosis not present

## 2022-12-04 DIAGNOSIS — E66811 Obesity, class 1: Secondary | ICD-10-CM

## 2022-12-04 DIAGNOSIS — E669 Obesity, unspecified: Secondary | ICD-10-CM | POA: Diagnosis not present

## 2022-12-04 DIAGNOSIS — K760 Fatty (change of) liver, not elsewhere classified: Secondary | ICD-10-CM

## 2022-12-04 DIAGNOSIS — E782 Mixed hyperlipidemia: Secondary | ICD-10-CM

## 2022-12-04 NOTE — Patient Instructions (Signed)

## 2022-12-05 LAB — COMPLETE METABOLIC PANEL WITH GFR
AG Ratio: 1.7 (calc) (ref 1.0–2.5)
ALT: 94 U/L — ABNORMAL HIGH (ref 9–46)
AST: 61 U/L — ABNORMAL HIGH (ref 10–40)
Albumin: 4.8 g/dL (ref 3.6–5.1)
Alkaline phosphatase (APISO): 60 U/L (ref 36–130)
BUN: 11 mg/dL (ref 7–25)
CO2: 29 mmol/L (ref 20–32)
Calcium: 10.2 mg/dL (ref 8.6–10.3)
Chloride: 102 mmol/L (ref 98–110)
Creat: 0.93 mg/dL (ref 0.60–1.24)
Globulin: 2.8 g/dL (calc) (ref 1.9–3.7)
Glucose, Bld: 93 mg/dL (ref 65–99)
Potassium: 3.8 mmol/L (ref 3.5–5.3)
Sodium: 143 mmol/L (ref 135–146)
Total Bilirubin: 0.6 mg/dL (ref 0.2–1.2)
Total Protein: 7.6 g/dL (ref 6.1–8.1)
eGFR: 114 mL/min/{1.73_m2} (ref >=60)

## 2022-12-05 LAB — LIPID PANEL
Cholesterol: 135 mg/dL (ref <200)
HDL: 39 mg/dL — ABNORMAL LOW (ref >=40)
LDL Cholesterol (Calc): 67 mg/dL (calc)
Non-HDL Cholesterol (Calc): 96 mg/dL (calc) (ref <130)
Total CHOL/HDL Ratio: 3.5 (calc) (ref <5.0)
Triglycerides: 217 mg/dL — ABNORMAL HIGH (ref <150)

## 2022-12-05 LAB — CBC WITH DIFFERENTIAL/PLATELET
Absolute Monocytes: 502 cells/uL (ref 200–950)
Basophils Absolute: 73 cells/uL (ref 0–200)
Basophils Relative: 1.1 %
Eosinophils Absolute: 112 cells/uL (ref 15–500)
Eosinophils Relative: 1.7 %
HCT: 47.1 % (ref 38.5–50.0)
Hemoglobin: 16.1 g/dL (ref 13.2–17.1)
Lymphs Abs: 2092 cells/uL (ref 850–3900)
MCH: 28 pg (ref 27.0–33.0)
MCHC: 34.2 g/dL (ref 32.0–36.0)
MCV: 82.1 fL (ref 80.0–100.0)
MPV: 10.5 fL (ref 7.5–12.5)
Monocytes Relative: 7.6 %
Neutro Abs: 3821 cells/uL (ref 1500–7800)
Neutrophils Relative %: 57.9 %
Platelets: 235 10*3/uL (ref 140–400)
RBC: 5.74 10*6/uL (ref 4.20–5.80)
RDW: 13.1 % (ref 11.0–15.0)
Total Lymphocyte: 31.7 %
WBC: 6.6 10*3/uL (ref 3.8–10.8)

## 2022-12-05 LAB — TSH: TSH: 1.51 mIU/L (ref 0.40–4.50)

## 2023-01-22 ENCOUNTER — Encounter: Payer: Managed Care, Other (non HMO) | Admitting: Nurse Practitioner

## 2023-03-10 NOTE — Progress Notes (Unsigned)
Complete Physical  Assessment and Plan: Health Maintenance- Discussed STD testing, safe sex, alcohol and drug awareness, drinking and driving dangers, wearing a seat belt and general safety measures for young adult. Recommended wearing sunscreen daily for skin cancer risk reduction.   Encounter for routine adult health examination without abnormal findings  Due Yearly  Essential hypertension Continue medication: olmesartan 40 mg daily and HCTZ 25 mg QD Monitor blood pressure at home; call if consistently over 130/80 Continue DASH diet.   Reminder to go to the ER if any CP, SOB, nausea, dizziness, severe HA, changes vision/speech, left arm numbness and tingling and jaw pain. -     CBC with Differential/Platelet -     COMPLETE METABOLIC PANEL WITH GFR -     Magnesium -     Urinalysis, Routine w reflex microscopic   Non-seasonal allergic rhinitis due to other allergic trigger Continue OTC allergy pills, nasal sprays, avoid triggers  Gastroesophageal reflux disease, esophagitis presence not specified Well managed on current medications, wasn't well controlled on H2i Discussed diet, avoiding triggers and other lifestyle changes -     Magnesium  Vitamin D deficiency -     VITAMIN D 25 Hydroxy (Vit-D Deficiency, Fractures)  Obesity (BMI 30.0-34.9) Long discussion about weight loss, diet, and exercise Recommended diet heavy in fruits and veggies and low in animal meats, cheeses, and dairy products, appropriate calorie intake Patient will work on reducing intake when working less, cut back on sugar/flour, increase fruits/veggies (aim to eat at least a serving with each meal) Discussed appropriate weight for height and initial goal (<215 lb) Follow up at next visit  Mixed hyperlipidemia Will begin Rosuvastatin if remains elevated Continue low cholesterol diet and exercise; advised to increase fiber intake Check lipid panel.  -     Lipid panel -     TSH  Abnormal Glucose Continue  diet and exercise - A1c  Family history of death due to heart problem at 38 years of age or younger Aggressive risk factor management Control blood pressure, cholesterol, glucose, increase exercise, maintain weight Will refer for screening at age 17    Discussed med's effects and SE's. Screening labs and tests as requested with regular follow-up as recommended. Over 40 minutes of exam, counseling, chart review and critical decision making was performed  Future Appointments  Date Time Provider Department Center  03/11/2023  9:00 AM Raynelle Dick, NP [facility] None  03/10/2024  9:00 AM Raynelle Dick, NP [facility] None    HPI  This very nice 31 y.o.male presents for complete physical.has Hypertension; GERD (gastroesophageal reflux disease); Allergic rhinitis due to allergen; Vitamin D deficiency; Family history of death due to heart problem at 52 years of age or younger; Hyperlipidemia; Obesity (BMI 30.0-34.9); and Hepatic steatosis on their problem list.   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   Uses astelin PRN allergies.   BMI is There is no height or weight on file to calculate BMI., he was working on diet,  He has not been doing well with diet. He does drink sweet tea and soda.  He does not eat a lot of red meat.  He gets lots of exercise with his job.  Wt Readings from Last 3 Encounters:  12/04/22 256 lb (116.1 kg)  08/23/22 253 lb 9.6 oz (115 kg)  04/23/22 249 lb (112.9 kg)   he has a  diagnosis of GERD which is currently managed by omeprazole 40 mg daily he reports symptoms is currently well controlled, and denies breakthrough reflux, burning in chest, hoarseness or cough.    His blood pressure has been controlled at home (115- 120s over 65-70s), today their BP is  ,   He does workout. He denies chest pain, shortness of breath, dizziness.   He is not on cholesterol medication,  taking a cholesteol health OTC supplement [facility]). His LDL cholesterol is not at goal. The cholesterol last visit was:   Lab Results  Component Value Date   CHOL 135 12/04/2022   HDL 39 (L) 12/04/2022   LDLCALC 67 12/04/2022   TRIG 217 (H) 12/04/2022   CHOLHDL 3.5 12/04/2022    He has been working on diet and exercise for glucose management. Last A1C in the office was:  Lab Results  Component Value Date   HGBA1C 5.2 01/21/2022   Lab Results  Component Value Date   GFRNONAA 119 01/18/2021   Patient is on Vitamin D supplement.   Lab Results  Component Value Date   VD25OH 65 08/23/2022       Current Medications:  Current Outpatient Medications on File Prior to Visit  Medication Sig Dispense Refill   albuterol (VENTOLIN HFA) 108 (90 Base) MCG/ACT inhaler Inhale 2 puffs into the lungs every 6 (six) hours as needed for wheezing or shortness of breath. 8 g 2   aspirin 81 MG chewable tablet Chew 81 mg daily by mouth.     Cholecalciferol 5000 units TABS Take 5,000 Units by mouth daily.      hydrochlorothiazide (HYDRODIURIL) 25 MG tablet Take 1 tablet (25 mg total) by mouth daily with breakfast. 90 tablet 1   Levocetirizine Dihydrochloride (XYZAL PO) Take by mouth.     olmesartan (BENICAR) 40 MG tablet TAKE 1 TABLET BY MOUTH DAILY FOR BLOOD PRESSURE 90 tablet 1   omeprazole (PRILOSEC) 40 MG capsule TAKE 1 CAPSULE BY MOUTH DAILY TO PREVENTHEARTBURN AND INDIGESTION 90 capsule 1   Phytosterol Esters (CHOLEST CARE PO) Take by mouth daily. Takes two tablets daily     rosuvastatin (CRESTOR) 5 MG tablet Take 1 tablet (5 mg total) by mouth daily. 30 tablet 11   No current facility-administered medications on file prior to visit.   Health Maintenance:    There is no immunization history on file for this patient.  TD/TDAP: 2017 Influenza: declines Pneumovax: n/a HPV vaccines: Had as a teen, 2/2 Covid 19: declines  Sexually Active: yes STD testing offered, declines Last Dental  Exam: goes q 6 months, last 2021 Last Eye Exam: Wears contacts, goes annually 2021, will schedule this year  Allergies: No Known Allergies Medical History:  has Hypertension; GERD (gastroesophageal reflux disease); Allergic rhinitis due to allergen; Vitamin D deficiency; Family history of death due to heart problem at 27 years of age or younger; Hyperlipidemia; Obesity (BMI 30.0-34.9); and Hepatic steatosis on their problem list. Surgical History:  He  has a past surgical history that includes Wisdom tooth extraction (2014). Family History:  Hisfamily history includes Diabetes in his maternal grandmother; Heart attack (age of onset: 47) in his paternal uncle; Heart attack (age of onset: 65) in his paternal grandfather; Heart failure (age of onset: 29) in his paternal grandmother; Hypertension in his father; Hypothyroidism in his mother. Social History:   reports that he has never smoked. His smokeless tobacco use includes chew. He reports that he does not drink alcohol and does not use drugs.  Review of Systems: Review of Systems  Constitutional:  Negative for malaise/fatigue and weight loss.  HENT:  Negative for hearing loss and tinnitus.   Eyes:  Negative for blurred vision and double vision.  Respiratory:  Negative for cough, sputum production, shortness of breath and wheezing.   Cardiovascular:  Negative for chest pain, palpitations, orthopnea, claudication, leg swelling and PND.  Gastrointestinal:  Negative for abdominal pain, blood in stool, constipation, diarrhea, heartburn, melena, nausea and vomiting.  Genitourinary: Negative.   Musculoskeletal:  Negative for falls, joint pain and myalgias.  Skin:  Negative for rash.  Neurological:  Negative for dizziness, tingling, sensory change, weakness and headaches.  Endo/Heme/Allergies:  Negative for polydipsia.  Psychiatric/Behavioral: Negative.  Negative for depression, memory loss, substance abuse and suicidal ideas. The patient is not  nervous/anxious and does not have insomnia.   All other systems reviewed and are negative.   Physical Exam: Estimated body mass index is 34.72 kg/m as calculated from the following:   Height as of 12/04/22: 6' (1.829 m).   Weight as of 12/04/22: 256 lb (116.1 kg). There were no vitals taken for this visit. General Appearance: Well nourished, in no apparent distress.  Eyes: PERRLA, EOMs, conjunctiva no swelling or erythema Sinuses: No Frontal/maxillary tenderness  ENT/Mouth: Ext aud canals clear, normal light reflex with TMs without erythema, bulging. Good dentition. No erythema, swelling, or exudate on post pharynx. Tonsils not swollen or erythematous. Hearing normal.  Neck: Supple, thyroid normal. No bruits  Respiratory: Respiratory effort normal, BS equal bilaterally without rales, rhonchi, wheezing or stridor.  Cardio: RRR without murmurs, rubs or gallops. Brisk peripheral pulses without edema.  Chest: symmetric, with normal excursions and percussion.  Abdomen: Soft, nontender, no guarding, rebound, hernias, masses, or organomegaly.  Lymphatics: Non tender without lymphadenopathy.  Genitourinary: defer, no issues Musculoskeletal: Full ROM all peripheral extremities,5/5 strength, and normal gait.  Skin: Warm, dry without rashes, lesions, ecchymosis. Neuro: Cranial nerves intact, reflexes equal bilaterally. Normal muscle tone, no cerebellar symptoms. Sensation intact.  Psych: Awake and oriented X 3, normal affect, Insight and Judgment appropriate.   EKG:  defer to next year  Jaime Little Hollie Salk 12:31 PM [facility]

## 2023-03-11 ENCOUNTER — Encounter: Payer: Self-pay | Admitting: Nurse Practitioner

## 2023-03-11 ENCOUNTER — Ambulatory Visit (INDEPENDENT_AMBULATORY_CARE_PROVIDER_SITE_OTHER): Payer: Commercial Managed Care - HMO | Admitting: Nurse Practitioner

## 2023-03-11 VITALS — BP 108/62 | HR 75 | Temp 97.7°F | Ht 72.0 in | Wt 253.4 lb

## 2023-03-11 DIAGNOSIS — Z131 Encounter for screening for diabetes mellitus: Secondary | ICD-10-CM

## 2023-03-11 DIAGNOSIS — Z0000 Encounter for general adult medical examination without abnormal findings: Secondary | ICD-10-CM

## 2023-03-11 DIAGNOSIS — E559 Vitamin D deficiency, unspecified: Secondary | ICD-10-CM

## 2023-03-11 DIAGNOSIS — Z9109 Other allergy status, other than to drugs and biological substances: Secondary | ICD-10-CM

## 2023-03-11 DIAGNOSIS — Z1329 Encounter for screening for other suspected endocrine disorder: Secondary | ICD-10-CM

## 2023-03-11 DIAGNOSIS — Z1389 Encounter for screening for other disorder: Secondary | ICD-10-CM | POA: Diagnosis not present

## 2023-03-11 DIAGNOSIS — Z0001 Encounter for general adult medical examination with abnormal findings: Secondary | ICD-10-CM

## 2023-03-11 DIAGNOSIS — Z13220 Encounter for screening for lipoid disorders: Secondary | ICD-10-CM

## 2023-03-11 DIAGNOSIS — Z8241 Family history of sudden cardiac death: Secondary | ICD-10-CM

## 2023-03-11 DIAGNOSIS — R7309 Other abnormal glucose: Secondary | ICD-10-CM

## 2023-03-11 DIAGNOSIS — I10 Essential (primary) hypertension: Secondary | ICD-10-CM

## 2023-03-11 DIAGNOSIS — R7989 Other specified abnormal findings of blood chemistry: Secondary | ICD-10-CM

## 2023-03-11 DIAGNOSIS — E782 Mixed hyperlipidemia: Secondary | ICD-10-CM

## 2023-03-11 DIAGNOSIS — E66811 Obesity, class 1: Secondary | ICD-10-CM

## 2023-03-11 DIAGNOSIS — Z79899 Other long term (current) drug therapy: Secondary | ICD-10-CM | POA: Diagnosis not present

## 2023-03-11 DIAGNOSIS — K760 Fatty (change of) liver, not elsewhere classified: Secondary | ICD-10-CM

## 2023-03-11 DIAGNOSIS — E669 Obesity, unspecified: Secondary | ICD-10-CM

## 2023-03-11 DIAGNOSIS — K219 Gastro-esophageal reflux disease without esophagitis: Secondary | ICD-10-CM

## 2023-03-11 NOTE — Patient Instructions (Signed)

## 2023-03-12 LAB — URINALYSIS, ROUTINE W REFLEX MICROSCOPIC
Bilirubin Urine: NEGATIVE
Glucose, UA: NEGATIVE
Hgb urine dipstick: NEGATIVE
Ketones, ur: NEGATIVE
Leukocytes,Ua: NEGATIVE
Nitrite: NEGATIVE
Protein, ur: NEGATIVE
Specific Gravity, Urine: 1.019 (ref 1.001–1.035)
pH: 7 (ref 5.0–8.0)

## 2023-03-12 LAB — COMPLETE METABOLIC PANEL WITH GFR
AG Ratio: 1.7 (calc) (ref 1.0–2.5)
ALT: 72 U/L — ABNORMAL HIGH (ref 9–46)
AST: 51 U/L — ABNORMAL HIGH (ref 10–40)
Albumin: 4.5 g/dL (ref 3.6–5.1)
Alkaline phosphatase (APISO): 58 U/L (ref 36–130)
BUN: 12 mg/dL (ref 7–25)
CO2: 30 mmol/L (ref 20–32)
Calcium: 9.9 mg/dL (ref 8.6–10.3)
Chloride: 99 mmol/L (ref 98–110)
Creat: 0.81 mg/dL (ref 0.60–1.24)
Globulin: 2.7 g/dL (calc) (ref 1.9–3.7)
Glucose, Bld: 94 mg/dL (ref 65–99)
Potassium: 3.8 mmol/L (ref 3.5–5.3)
Sodium: 141 mmol/L (ref 135–146)
Total Bilirubin: 0.9 mg/dL (ref 0.2–1.2)
Total Protein: 7.2 g/dL (ref 6.1–8.1)
eGFR: 122 mL/min/{1.73_m2} (ref >=60)

## 2023-03-12 LAB — CBC WITH DIFFERENTIAL/PLATELET
Absolute Monocytes: 531 cells/uL (ref 200–950)
Basophils Absolute: 77 cells/uL (ref 0–200)
Basophils Relative: 1.2 %
Eosinophils Absolute: 70 cells/uL (ref 15–500)
Eosinophils Relative: 1.1 %
HCT: 43.7 % (ref 38.5–50.0)
Hemoglobin: 15 g/dL (ref 13.2–17.1)
Lymphs Abs: 2003 cells/uL (ref 850–3900)
MCH: 28.6 pg (ref 27.0–33.0)
MCHC: 34.3 g/dL (ref 32.0–36.0)
MCV: 83.2 fL (ref 80.0–100.0)
MPV: 10.7 fL (ref 7.5–12.5)
Monocytes Relative: 8.3 %
Neutro Abs: 3718 cells/uL (ref 1500–7800)
Neutrophils Relative %: 58.1 %
Platelets: 235 10*3/uL (ref 140–400)
RBC: 5.25 10*6/uL (ref 4.20–5.80)
RDW: 13.1 % (ref 11.0–15.0)
Total Lymphocyte: 31.3 %
WBC: 6.4 10*3/uL (ref 3.8–10.8)

## 2023-03-12 LAB — TSH: TSH: 1.68 mIU/L (ref 0.40–4.50)

## 2023-03-12 LAB — LIPID PANEL
Cholesterol: 136 mg/dL (ref <200)
HDL: 36 mg/dL — ABNORMAL LOW (ref >=40)
LDL Cholesterol (Calc): 66 mg/dL (calc)
Non-HDL Cholesterol (Calc): 100 mg/dL (calc) (ref <130)
Total CHOL/HDL Ratio: 3.8 (calc) (ref <5.0)
Triglycerides: 261 mg/dL — ABNORMAL HIGH (ref <150)

## 2023-03-12 LAB — MAGNESIUM: Magnesium: 1.9 mg/dL (ref 1.5–2.5)

## 2023-03-12 LAB — HEMOGLOBIN A1C
Hgb A1c MFr Bld: 5.5 % of total Hgb (ref <5.7)
Mean Plasma Glucose: 111 mg/dL
eAG (mmol/L): 6.2 mmol/L

## 2023-03-12 LAB — VITAMIN D 25 HYDROXY (VIT D DEFICIENCY, FRACTURES): Vit D, 25-Hydroxy: 66 ng/mL (ref 30–100)

## 2023-03-19 ENCOUNTER — Other Ambulatory Visit: Payer: Self-pay | Admitting: Nurse Practitioner

## 2023-03-19 DIAGNOSIS — I10 Essential (primary) hypertension: Secondary | ICD-10-CM

## 2023-05-01 IMAGING — US US ABDOMEN COMPLETE
2 series · 14 of 25 positions shown · non-contrast
Comparison: None Available.

CLINICAL DATA: Elevated LFTs

EXAM:
ABDOMEN ULTRASOUND COMPLETE

[Series 1: us abdomen complete · 0.22mm/px · 13 of 101 slices shown (1 of 2)]
[im 1/101]
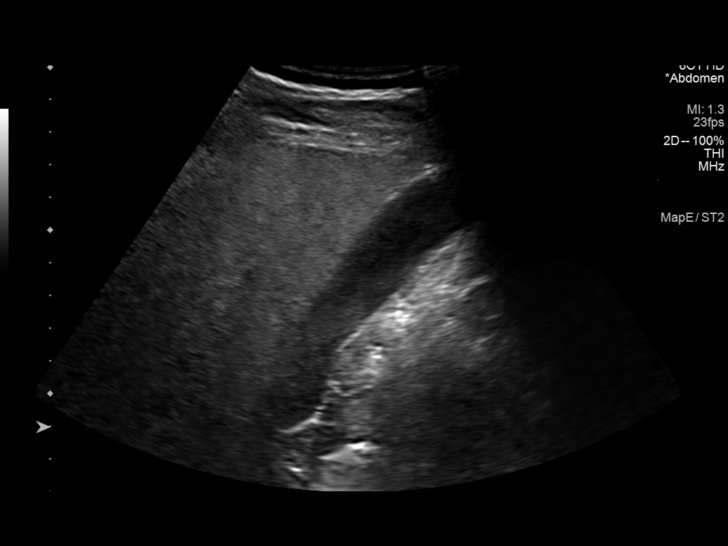
[im 9/101]
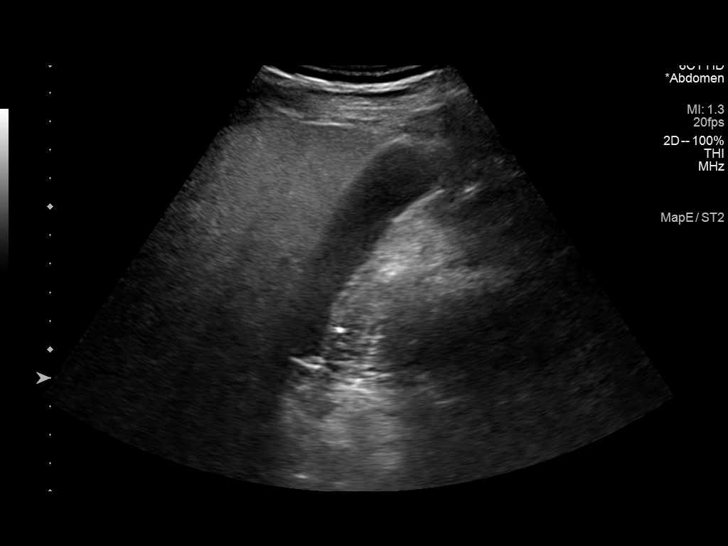
[im 18/101]
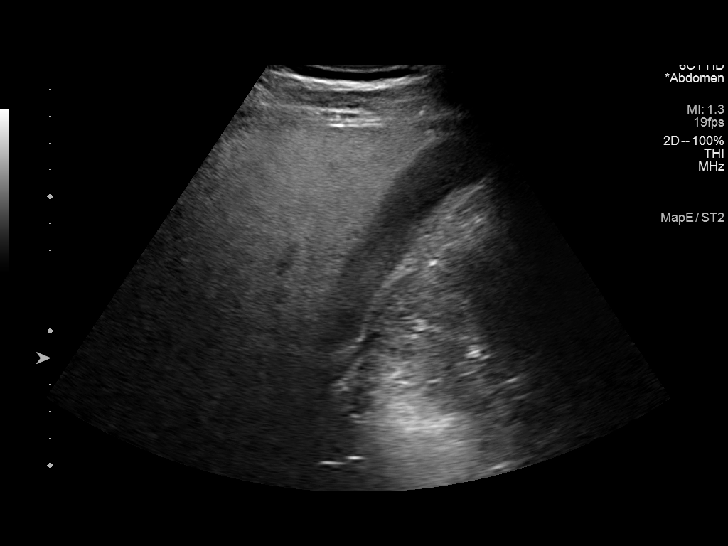
[im 27/101]
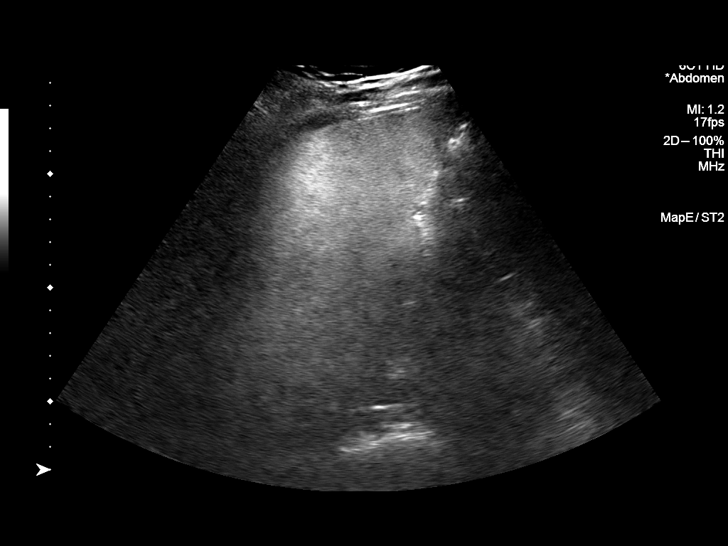
[im 35/101]
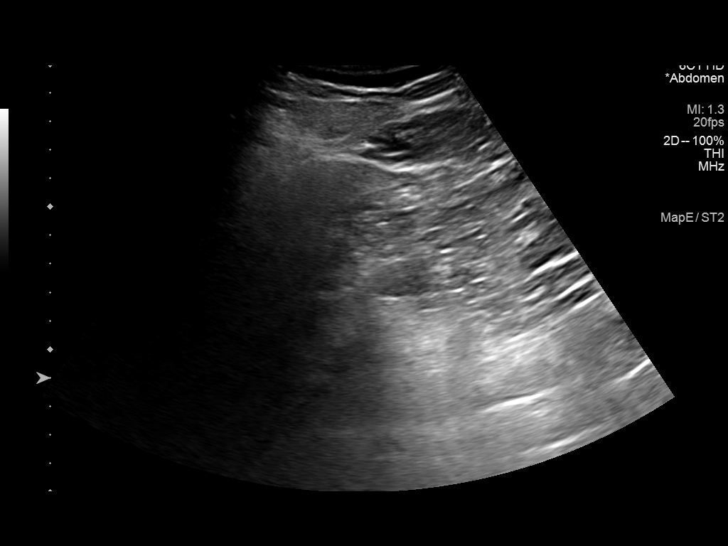
[im 40/101]
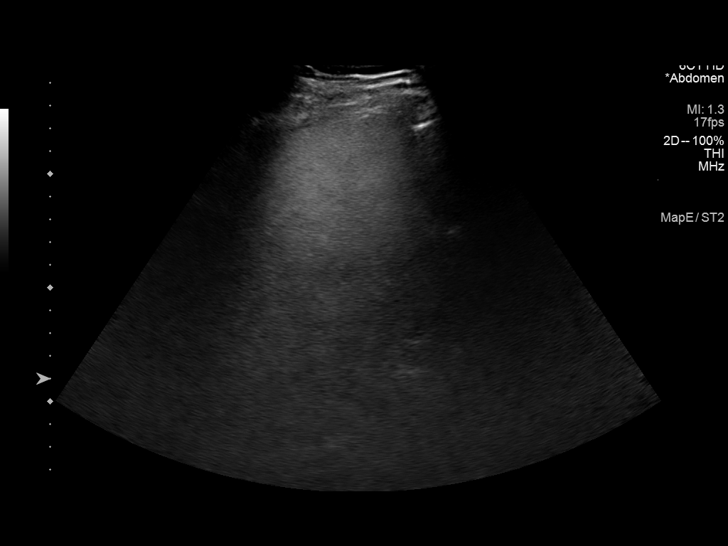
[im 48/101]
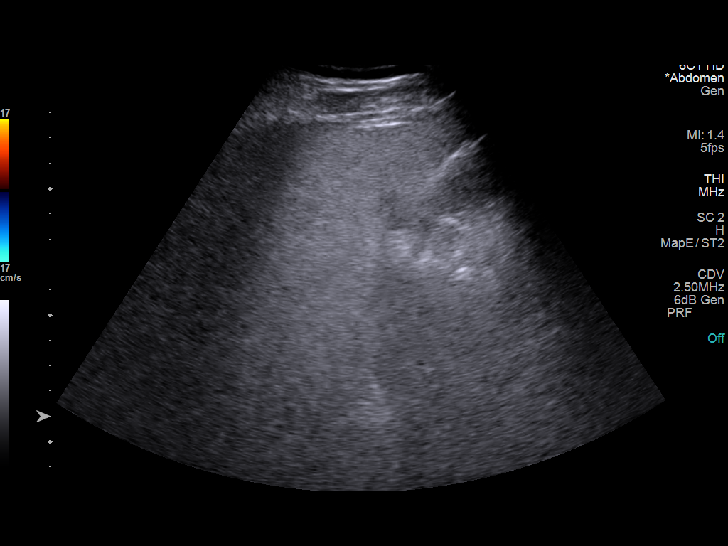
[im 57/101]
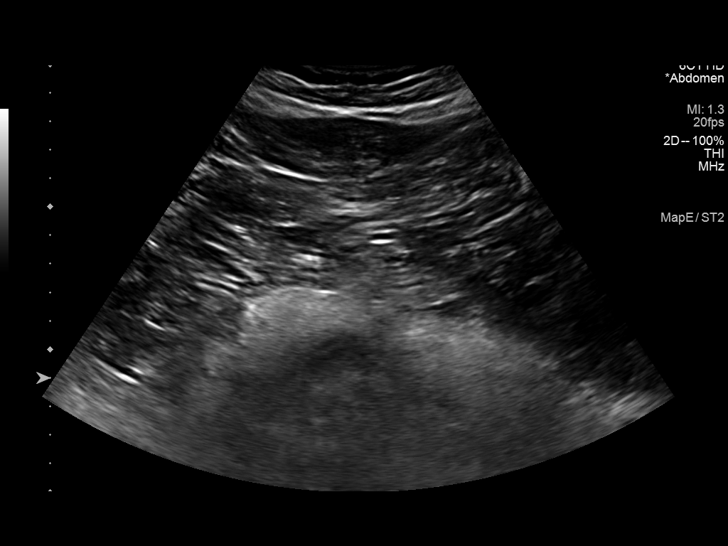
[im 66/101]
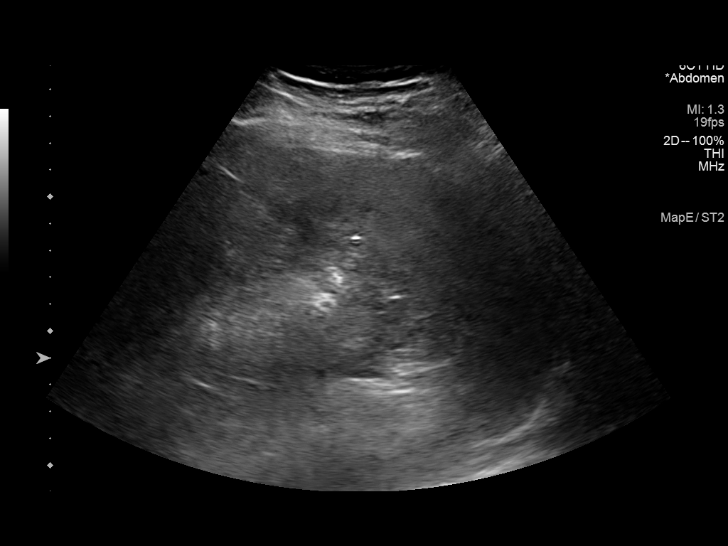
[im 70/101]
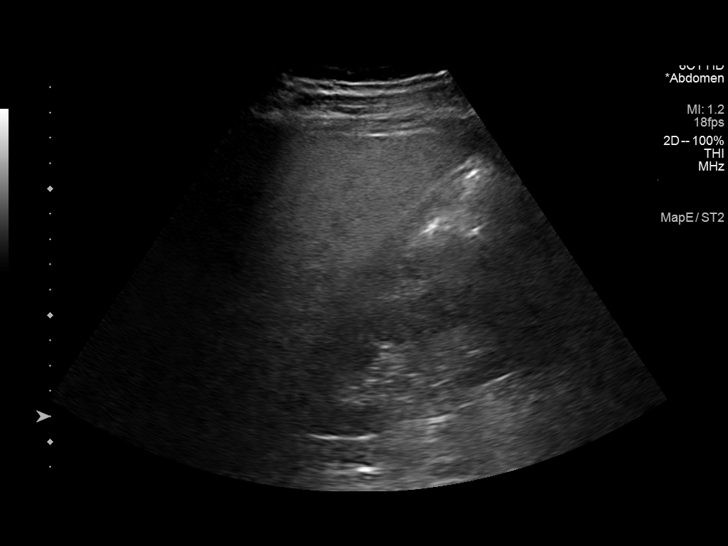
[im 79/101]
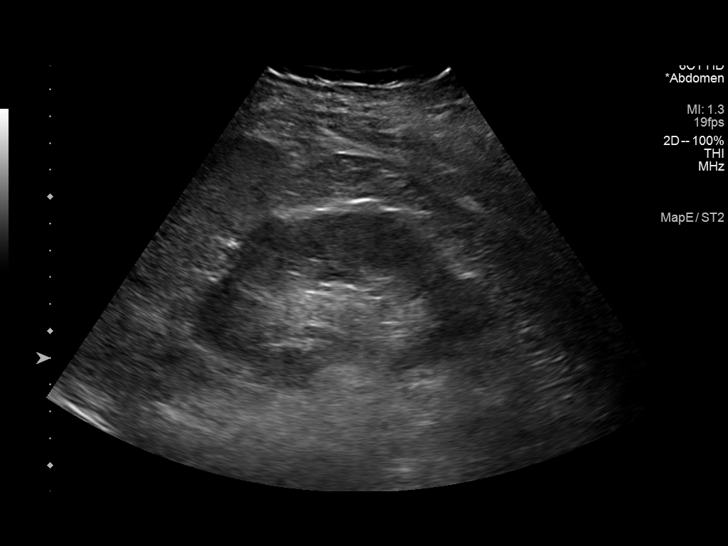
[im 87/101]
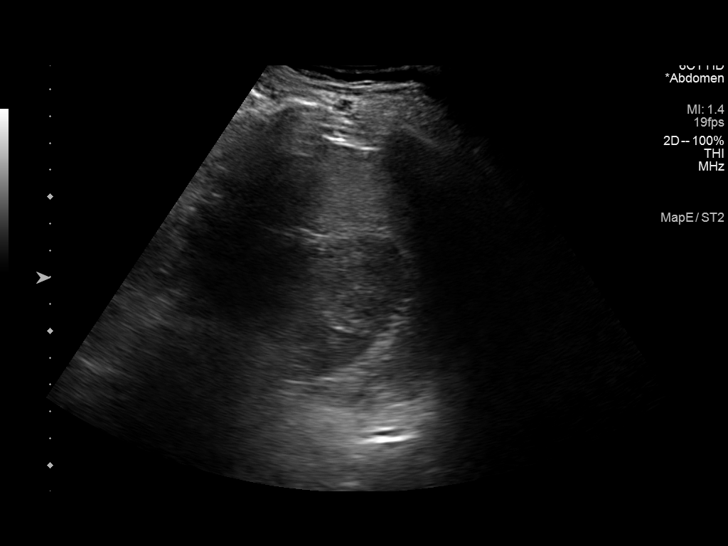
[im 96/101]
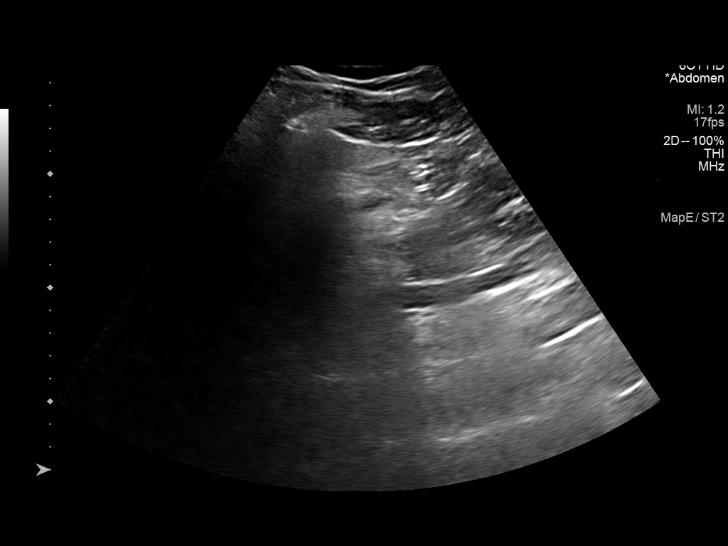

[Series 2001: us abdomen complete · 0.28mm/px · 1 of 1 slices shown (2 of 2)]
[im 1/1]
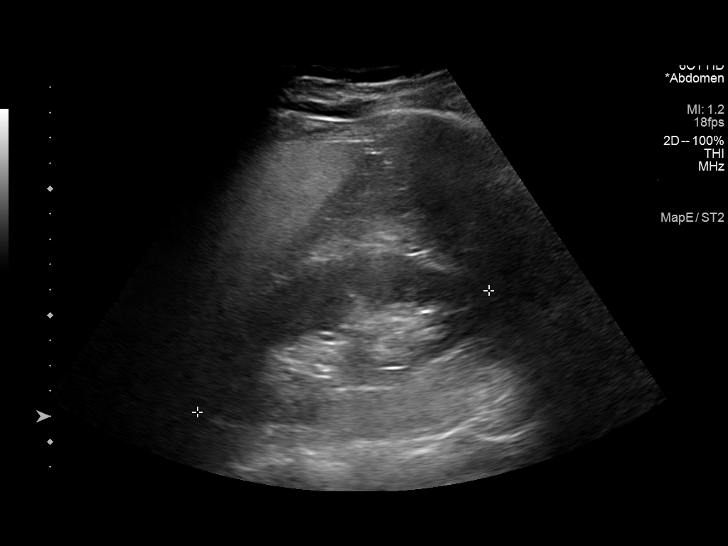

[14 of 25 positions shown; findings below may reference images not displayed]

FINDINGS: Gallbladder: No gallstones or wall thickening visualized. No
sonographic Murphy sign noted by sonographer.

Common bile duct: Diameter: 6 mm

Liver: Appears enlarged with diffuse increased echogenicity of the
parenchyma with limited penetration. No focal mass identified.
Portal vein is patent on color Doppler imaging with normal direction
of blood flow towards the liver.

IVC: No abnormality visualized.

Pancreas: Not well visualized.

Spleen: Size and appearance within normal limits.

Right Kidney: Length: 12.5 cm. Echogenicity within normal limits. No
mass or hydronephrosis visualized.

Left Kidney: Length: 11.9 cm. Echogenicity within normal limits. No
mass or hydronephrosis visualized.

Abdominal aorta: No aneurysm visualized.

Other findings: None.
IMPRESSION: Hepatomegaly and evidence of hepatic steatosis.

## 2023-05-13 ENCOUNTER — Other Ambulatory Visit: Payer: Self-pay | Admitting: Nurse Practitioner

## 2023-05-13 ENCOUNTER — Telehealth: Payer: Self-pay | Admitting: Nurse Practitioner

## 2023-05-13 DIAGNOSIS — E782 Mixed hyperlipidemia: Secondary | ICD-10-CM

## 2023-05-13 DIAGNOSIS — K219 Gastro-esophageal reflux disease without esophagitis: Secondary | ICD-10-CM

## 2023-05-13 MED ORDER — ROSUVASTATIN CALCIUM 5 MG PO TABS: 5.0000 mg | ORAL_TABLET | Freq: Every day | ORAL | 3 refills | Status: DC

## 2023-05-13 NOTE — Telephone Encounter (Signed)
Requesting a refill on Rosuvastatin for 90 days instead of 30 days. Will

## 2023-09-09 ENCOUNTER — Other Ambulatory Visit: Payer: Self-pay

## 2023-09-09 DIAGNOSIS — R7309 Other abnormal glucose: Secondary | ICD-10-CM | POA: Insufficient documentation

## 2023-09-09 DIAGNOSIS — I10 Essential (primary) hypertension: Secondary | ICD-10-CM

## 2023-09-09 NOTE — Progress Notes (Unsigned)
FOLLOW UP  Assessment and Plan:   Essential Hypertension Controlled on Olmesartan 40 mg and HCTZ 25 mg daily Monitor blood pressure at home; patient to call if consistently greater than 130/80 Continue DASH diet.   Reminder to go to the ER if any CP, SOB, nausea, dizziness, severe HA, changes vision/speech, left arm numbness and tingling and jaw pain. -CBC  Mixed Hyperlipidemia Mild elevations controlled with Rosuvastatin 5 mg daily  Continue low cholesterol diet and exercise.  Check lipid panel, CMP  Obesity with co morbidities Commended progress with weight loss- Long discussion about weight loss, diet, and exercise Patient will work on watching portions, decreasing saturated fats and simple carbs, limit alcohol and Tylenol Will follow up in 6 months  Abnormal Glucose Continue diet, exercise and weight loss  Vitamin D Def At goal at last visit; continue supplementation to maintain goal of 60-100 Defer Vit D level  Elevated LFT's/Hepatic steatosis Continue diet, exercise and limit Tylenol and alcohol - CMP  Medication Management - TSH  Continue diet and meds as discussed. Further disposition pending results of labs. Discussed med's effects and SE's.   Over 30 minutes of exam, counseling, chart review, and critical decision making was performed.   Future Appointments  Date Time Provider Department Center  09/10/2023  9:30 AM Raynelle Dick, NP [facility] None  03/10/2024  9:00 AM Raynelle Dick, NP [facility] None    ----------------------------------------------------------------------------------------------------------------------  HPI 32 y.o. male  presents for 6 month follow up on hypertension, cholesterol, weight and vitamin D deficiency.   BMI is There is no height or weight on file to calculate BMI., he has been working on diet and exercise,  he works outside. He has not been following his diet as well.  Wt Readings from Last 3 Encounters:  03/11/23  253 lb 6.4 oz (114.9 kg)  12/04/22 256 lb (116.1 kg)  08/23/22 253 lb 9.6 oz (115 kg)   His blood pressure has been controlled at home (120/70s), today their BP is   . Currently on Olmesartan 40 mg daily BP Readings from Last 3 Encounters:  03/11/23 108/62  12/04/22 102/62  08/23/22 120/78   He does workout. He denies chest pain, shortness of breath, dizziness.    He is on cholesterol medication, Rosuvastatin 5 mg and denies myalgias. His cholesterol is not at goal. The cholesterol last visit was:   Lab Results  Component Value Date   CHOL 136 03/11/2023   HDL 36 (L) 03/11/2023   LDLCALC 66 03/11/2023   TRIG 261 (H) 03/11/2023   CHOLHDL 3.8 03/11/2023   Last J1B in the office was:  Lab Results  Component Value Date   HGBA1C 5.5 03/11/2023   Patient is on Vitamin D supplement.   Lab Results  Component Value Date   VD25OH 66 03/11/2023     He had U/S 04/26/22 showing hepatic steatosis. Last LFT's were: Lab Results  Component Value Date   ALT 72 (H) 03/11/2023   AST 51 (H) 03/11/2023   BILITOT 0.9 03/11/2023     Current Medications:  Current Outpatient Medications on File Prior to Visit  Medication Sig   albuterol (VENTOLIN HFA) 108 (90 Base) MCG/ACT inhaler Inhale 2 puffs into the lungs every 6 (six) hours as needed for wheezing or shortness of breath.   aspirin 81 MG chewable tablet Chew 81 mg daily by mouth.   Cetirizine HCl (ZYRTEC ALLERGY PO) Take by mouth.   Cholecalciferol 5000 units TABS Take 5,000 Units by mouth  daily.    hydrochlorothiazide (HYDRODIURIL) 25 MG tablet TAKE 1 TABLET BY MOUTH DAILY WITH BREAKFAST   Levocetirizine Dihydrochloride (XYZAL PO) Take by mouth. (Patient not taking: Reported on 03/11/2023)   olmesartan (BENICAR) 40 MG tablet TAKE 1 TABLET BY MOUTH DAILY FOR BLOOD PRESSURE   omeprazole (PRILOSEC) 40 MG capsule TAKE 1 CAPSULE BY MOUTH DAILY TO PREVENTHEARTBURN AND INDIGESTION   Phytosterol Esters (CHOLEST CARE PO) Take by mouth daily. Takes  two tablets daily   rosuvastatin (CRESTOR) 5 MG tablet Take 1 tablet (5 mg total) by mouth daily.   No current facility-administered medications on file prior to visit.     Allergies: No Known Allergies   Medical History:  Past Medical History:  Diagnosis Date   GERD without esophagitis    Hypertension    Family history- Reviewed and unchanged Social history- Reviewed and unchanged   Review of Systems:  Review of Systems  Constitutional:  Negative for malaise/fatigue and weight loss.  HENT:  Negative for hearing loss and tinnitus.   Eyes:  Negative for blurred vision and double vision.  Respiratory:  Negative for cough, shortness of breath and wheezing.   Cardiovascular:  Negative for chest pain, palpitations, orthopnea, claudication and leg swelling.  Gastrointestinal:  Negative for abdominal pain, blood in stool, constipation, diarrhea, heartburn, melena, nausea and vomiting.  Genitourinary: Negative.   Musculoskeletal:  Negative for joint pain and myalgias.  Skin:  Negative for rash.  Neurological:  Negative for dizziness, tingling, sensory change, weakness and headaches.  Endo/Heme/Allergies:  Negative for polydipsia.  Psychiatric/Behavioral: Negative.    All other systems reviewed and are negative.    Physical Exam: There were no vitals taken for this visit. Wt Readings from Last 3 Encounters:  03/11/23 253 lb 6.4 oz (114.9 kg)  12/04/22 256 lb (116.1 kg)  08/23/22 253 lb 9.6 oz (115 kg)   General Appearance: Well nourished, in no apparent distress. Eyes: PERRLA, EOMs, conjunctiva no swelling or erythema Sinuses: No Frontal/maxillary tenderness ENT/Mouth: Ext aud canals clear, TMs without erythema, bulging. No erythema, swelling, or exudate on post pharynx.  Tonsils not swollen or erythematous. Hearing normal.  Neck: Supple, thyroid normal.  Respiratory: Respiratory effort normal, BS equal bilaterally without rales, rhonchi, wheezing or stridor.  Cardio: RRR  with no MRGs. Brisk peripheral pulses without edema.  Abdomen: Soft, + BS.  Non tender, no guarding, rebound, hernias, masses. Lymphatics: Non tender without lymphadenopathy.  Musculoskeletal: Full ROM, 5/5 strength, Normal gait Skin: Warm, dry without rashes, lesions, ecchymosis.  Neuro: Cranial nerves intact. No cerebellar symptoms.  Psych: Awake and oriented X 3, normal affect, Insight and Judgment appropriate.    Raynelle Dick, NP 12:29 PM [facility]

## 2023-09-10 ENCOUNTER — Encounter: Payer: Self-pay | Admitting: Nurse Practitioner

## 2023-09-10 ENCOUNTER — Ambulatory Visit (INDEPENDENT_AMBULATORY_CARE_PROVIDER_SITE_OTHER): Payer: Managed Care, Other (non HMO) | Admitting: Nurse Practitioner

## 2023-09-10 VITALS — BP 100/62 | HR 72 | Temp 98.1°F | Ht 72.0 in | Wt 252.6 lb

## 2023-09-10 DIAGNOSIS — R7309 Other abnormal glucose: Secondary | ICD-10-CM

## 2023-09-10 DIAGNOSIS — K219 Gastro-esophageal reflux disease without esophagitis: Secondary | ICD-10-CM

## 2023-09-10 DIAGNOSIS — I10 Essential (primary) hypertension: Secondary | ICD-10-CM

## 2023-09-10 DIAGNOSIS — R7989 Other specified abnormal findings of blood chemistry: Secondary | ICD-10-CM

## 2023-09-10 DIAGNOSIS — E66811 Obesity, class 1: Secondary | ICD-10-CM | POA: Diagnosis not present

## 2023-09-10 DIAGNOSIS — K760 Fatty (change of) liver, not elsewhere classified: Secondary | ICD-10-CM

## 2023-09-10 DIAGNOSIS — Z79899 Other long term (current) drug therapy: Secondary | ICD-10-CM | POA: Diagnosis not present

## 2023-09-10 DIAGNOSIS — E559 Vitamin D deficiency, unspecified: Secondary | ICD-10-CM

## 2023-09-10 DIAGNOSIS — E782 Mixed hyperlipidemia: Secondary | ICD-10-CM | POA: Diagnosis not present

## 2023-09-10 MED ORDER — OLMESARTAN MEDOXOMIL 40 MG PO TABS: 40.0000 mg | ORAL_TABLET | Freq: Every day | ORAL | 2 refills | Status: DC

## 2023-09-10 NOTE — Patient Instructions (Signed)

## 2023-09-11 LAB — COMPLETE METABOLIC PANEL WITH GFR
AG Ratio: 1.5 (calc) (ref 1.0–2.5)
ALT: 72 U/L — ABNORMAL HIGH (ref 9–46)
AST: 58 U/L — ABNORMAL HIGH (ref 10–40)
Albumin: 4.8 g/dL (ref 3.6–5.1)
Alkaline phosphatase (APISO): 57 U/L (ref 36–130)
BUN: 11 mg/dL (ref 7–25)
CO2: 34 mmol/L — ABNORMAL HIGH (ref 20–32)
Calcium: 10.4 mg/dL — ABNORMAL HIGH (ref 8.6–10.3)
Chloride: 98 mmol/L (ref 98–110)
Creat: 0.93 mg/dL (ref 0.60–1.26)
Globulin: 3.2 g/dL (ref 1.9–3.7)
Glucose, Bld: 102 mg/dL — ABNORMAL HIGH (ref 65–99)
Potassium: 4.1 mmol/L (ref 3.5–5.3)
Sodium: 140 mmol/L (ref 135–146)
Total Bilirubin: 1.1 mg/dL (ref 0.2–1.2)
Total Protein: 8 g/dL (ref 6.1–8.1)
eGFR: 113 mL/min/{1.73_m2} (ref >=60)

## 2023-09-11 LAB — CBC WITH DIFFERENTIAL/PLATELET
Absolute Lymphocytes: 1885 {cells}/uL (ref 850–3900)
Absolute Monocytes: 580 {cells}/uL (ref 200–950)
Basophils Absolute: 58 {cells}/uL (ref 0–200)
Basophils Relative: 1 %
Eosinophils Absolute: 81 {cells}/uL (ref 15–500)
Eosinophils Relative: 1.4 %
HCT: 47.2 % (ref 38.5–50.0)
Hemoglobin: 15.7 g/dL (ref 13.2–17.1)
MCH: 29 pg (ref 27.0–33.0)
MCHC: 33.3 g/dL (ref 32.0–36.0)
MCV: 87.1 fL (ref 80.0–100.0)
MPV: 10.6 fL (ref 7.5–12.5)
Monocytes Relative: 10 %
Neutro Abs: 3196 {cells}/uL (ref 1500–7800)
Neutrophils Relative %: 55.1 %
Platelets: 226 10*3/uL (ref 140–400)
RBC: 5.42 10*6/uL (ref 4.20–5.80)
RDW: 12.7 % (ref 11.0–15.0)
Total Lymphocyte: 32.5 %
WBC: 5.8 10*3/uL (ref 3.8–10.8)

## 2023-09-11 LAB — MAGNESIUM: Magnesium: 2 mg/dL (ref 1.5–2.5)

## 2023-09-11 LAB — LIPID PANEL
Cholesterol: 160 mg/dL (ref <200)
HDL: 41 mg/dL (ref >=40)
LDL Cholesterol (Calc): 86 mg/dL
Non-HDL Cholesterol (Calc): 119 mg/dL (ref <130)
Total CHOL/HDL Ratio: 3.9 (calc) (ref <5.0)
Triglycerides: 251 mg/dL — ABNORMAL HIGH (ref <150)

## 2023-11-17 ENCOUNTER — Other Ambulatory Visit: Payer: Self-pay

## 2023-11-17 DIAGNOSIS — K219 Gastro-esophageal reflux disease without esophagitis: Secondary | ICD-10-CM

## 2023-11-17 MED ORDER — OMEPRAZOLE 40 MG PO CPDR: DELAYED_RELEASE_CAPSULE | ORAL | 1 refills | Status: DC

## 2023-11-18 ENCOUNTER — Other Ambulatory Visit: Payer: Self-pay

## 2023-11-18 MED ORDER — HYDROCHLOROTHIAZIDE 25 MG PO TABS: 25.0000 mg | ORAL_TABLET | Freq: Every day | ORAL | 1 refills | Status: DC

## 2024-03-10 ENCOUNTER — Encounter: Payer: Managed Care, Other (non HMO) | Admitting: Nurse Practitioner

## 2024-05-14 ENCOUNTER — Encounter: Payer: Self-pay | Admitting: Family Medicine

## 2024-05-14 ENCOUNTER — Ambulatory Visit: Payer: Self-pay | Admitting: Family Medicine

## 2024-05-14 ENCOUNTER — Other Ambulatory Visit: Payer: Self-pay | Admitting: Family Medicine

## 2024-05-14 ENCOUNTER — Ambulatory Visit (INDEPENDENT_AMBULATORY_CARE_PROVIDER_SITE_OTHER): Admitting: Family Medicine

## 2024-05-14 VITALS — BP 116/80 | HR 75 | Temp 97.8°F | Ht 72.0 in | Wt 238.0 lb

## 2024-05-14 DIAGNOSIS — Z6832 Body mass index (BMI) 32.0-32.9, adult: Secondary | ICD-10-CM

## 2024-05-14 DIAGNOSIS — I10 Essential (primary) hypertension: Secondary | ICD-10-CM | POA: Diagnosis not present

## 2024-05-14 DIAGNOSIS — E782 Mixed hyperlipidemia: Secondary | ICD-10-CM

## 2024-05-14 DIAGNOSIS — K760 Fatty (change of) liver, not elsewhere classified: Secondary | ICD-10-CM

## 2024-05-14 DIAGNOSIS — E66811 Obesity, class 1: Secondary | ICD-10-CM

## 2024-05-14 DIAGNOSIS — K219 Gastro-esophageal reflux disease without esophagitis: Secondary | ICD-10-CM

## 2024-05-14 DIAGNOSIS — R160 Hepatomegaly, not elsewhere classified: Secondary | ICD-10-CM

## 2024-05-14 DIAGNOSIS — E559 Vitamin D deficiency, unspecified: Secondary | ICD-10-CM | POA: Diagnosis not present

## 2024-05-14 DIAGNOSIS — R7989 Other specified abnormal findings of blood chemistry: Secondary | ICD-10-CM | POA: Diagnosis not present

## 2024-05-14 DIAGNOSIS — J4520 Mild intermittent asthma, uncomplicated: Secondary | ICD-10-CM

## 2024-05-14 LAB — BASIC METABOLIC PANEL WITH GFR
BUN: 23 mg/dL (ref 6–23)
CO2: 31 meq/L (ref 19–32)
Calcium: 10 mg/dL (ref 8.4–10.5)
Chloride: 96 meq/L (ref 96–112)
Creatinine, Ser: 1.12 mg/dL (ref 0.40–1.50)
GFR: 87.85 mL/min (ref >60.00)
Glucose, Bld: 104 mg/dL — ABNORMAL HIGH (ref 70–99)
Potassium: 3.8 meq/L (ref 3.5–5.1)
Sodium: 137 meq/L (ref 135–145)

## 2024-05-14 LAB — CBC WITH DIFFERENTIAL/PLATELET
Basophils Absolute: 0.1 10*3/uL (ref 0.0–0.1)
Basophils Relative: 0.9 % (ref 0.0–3.0)
Eosinophils Absolute: 0.1 10*3/uL (ref 0.0–0.7)
Eosinophils Relative: 1.5 % (ref 0.0–5.0)
HCT: 43.3 % (ref 39.0–52.0)
Hemoglobin: 15.2 g/dL (ref 13.0–17.0)
Lymphocytes Relative: 29.8 % (ref 12.0–46.0)
Lymphs Abs: 2.1 10*3/uL (ref 0.7–4.0)
MCHC: 35 g/dL (ref 30.0–36.0)
MCV: 82.6 fl (ref 78.0–100.0)
Monocytes Absolute: 0.6 10*3/uL (ref 0.1–1.0)
Monocytes Relative: 9.1 % (ref 3.0–12.0)
Neutro Abs: 4.1 10*3/uL (ref 1.4–7.7)
Neutrophils Relative %: 58.7 % (ref 43.0–77.0)
Platelets: 225 10*3/uL (ref 150.0–400.0)
RBC: 5.24 Mil/uL (ref 4.22–5.81)
RDW: 13.7 % (ref 11.5–15.5)
WBC: 7 10*3/uL (ref 4.0–10.5)

## 2024-05-14 LAB — HEPATIC FUNCTION PANEL
ALT: 43 U/L (ref 0–53)
AST: 38 U/L — ABNORMAL HIGH (ref 0–37)
Albumin: 4.8 g/dL (ref 3.5–5.2)
Alkaline Phosphatase: 66 U/L (ref 39–117)
Bilirubin, Direct: 0.2 mg/dL (ref 0.0–0.3)
Total Bilirubin: 1.4 mg/dL — ABNORMAL HIGH (ref 0.2–1.2)
Total Protein: 7.8 g/dL (ref 6.0–8.3)

## 2024-05-14 LAB — PROTIME-INR
INR: 1.1 ratio — ABNORMAL HIGH (ref 0.8–1.0)
Prothrombin Time: 11.3 s (ref 9.6–13.1)

## 2024-05-14 LAB — HEMOGLOBIN A1C: Hgb A1c MFr Bld: 5.2 % (ref 4.6–6.5)

## 2024-05-14 LAB — TSH: TSH: 0.96 u[IU]/mL (ref 0.35–5.50)

## 2024-05-14 LAB — LIPID PANEL
Cholesterol: 142 mg/dL (ref 0–200)
HDL: 36.2 mg/dL — ABNORMAL LOW (ref >39.00)
LDL Cholesterol: 54 mg/dL (ref 0–99)
NonHDL: 105.71
Total CHOL/HDL Ratio: 4
Triglycerides: 257 mg/dL — ABNORMAL HIGH (ref 0.0–149.0)
VLDL: 51.4 mg/dL — ABNORMAL HIGH (ref 0.0–40.0)

## 2024-05-14 LAB — VITAMIN D 25 HYDROXY (VIT D DEFICIENCY, FRACTURES): VITD: 79.9 ng/mL (ref 30.00–100.00)

## 2024-05-14 LAB — FERRITIN: Ferritin: 116.8 ng/mL (ref 22.0–322.0)

## 2024-05-14 LAB — GAMMA GT: GGT: 24 U/L (ref 7–51)

## 2024-05-14 LAB — T4, FREE: Free T4: 0.92 ng/dL (ref 0.60–1.60)

## 2024-05-14 MED ORDER — HYDROCHLOROTHIAZIDE 25 MG PO TABS: 25.0000 mg | ORAL_TABLET | Freq: Every day | ORAL | 1 refills | Status: DC

## 2024-05-14 MED ORDER — OLMESARTAN MEDOXOMIL 40 MG PO TABS: 40.0000 mg | ORAL_TABLET | Freq: Every day | ORAL | 1 refills | Status: DC

## 2024-05-14 NOTE — Telephone Encounter (Unsigned)
 Copied from CRM [id number]. Topic: Clinical - Medication Refill >> May 14, 2024  4:46 PM Berneda F wrote: Medication:  omeprazole  (PRILOSEC) 40 MG capsule   Has the patient contacted their pharmacy? Yes (Agent: If no, request that the patient contact the pharmacy for the refill. If patient does not wish to contact the pharmacy document the reason why and proceed with request.) (Agent: If yes, when and what did the pharmacy advise?)  This is the patient's preferred pharmacy:  [facility] - [place], [place] - [address] [place] [address] Phone: [phone] Fax: [phone]  Is this the correct pharmacy for this prescription? Yes If no, delete pharmacy and type the correct one.   Has the prescription been filled recently? No  Is the patient out of the medication? Yes  Has the patient been seen for an appointment in the last year OR does the patient have an upcoming appointment? Yes  Can we respond through MyChart? Yes  Agent: Please be advised that Rx refills may take up to 3 business days. We ask that you follow-up with your pharmacy.

## 2024-05-14 NOTE — Progress Notes (Unsigned)
 New Patient Office Visit  Subjective    Patient ID: Jaime Little, male    DOB: 1991-06-09  Age: 33 y.o. MRN: [id number]  CC:  Chief Complaint  Patient presents with   Establish Care    No concerns   HPI Jaime Little presents to establish care Previous PCP at Dr. Polly   Takes NSAIDs prn, not regularly.  No alcohol.   Does not eat a lot of vegetables.   He exercises.   Lost 14 lbs   HTN-   HLD -   Takes an aspirin 81 mg   GERD- since 6th grade. Omeprazole  40 mg daily. Cannot go without it. Symptoms controlled otherwise.   Vitamin D  supplement   Family hx of HTN and HLD   Aura  Married 2 kids    Denies smoking   Outpatient Encounter Medications as of 05/14/2024  Medication Sig   albuterol  (VENTOLIN  HFA) 108 (90 Base) MCG/ACT inhaler Inhale 2 puffs into the lungs every 6 (six) hours as needed for wheezing or shortness of breath.   aspirin 81 MG chewable tablet Chew 81 mg daily by mouth.   Cholecalciferol 5000 units TABS Take 5,000 Units by mouth daily.    omeprazole  (PRILOSEC) 40 MG capsule TAKE 1 CAPSULE BY MOUTH DAILY TO PREVENTHEARTBURN AND INDIGESTION   Phytosterol Esters (CHOLEST CARE PO) Take by mouth daily. Takes two tablets daily   rosuvastatin  (CRESTOR ) 5 MG tablet Take 1 tablet (5 mg total) by mouth daily.   [DISCONTINUED] hydrochlorothiazide  (HYDRODIURIL ) 25 MG tablet Take 1 tablet (25 mg total) by mouth daily with breakfast.   [DISCONTINUED] olmesartan  (BENICAR ) 40 MG tablet Take 1 tablet (40 mg total) by mouth daily. for blood pressure   Cetirizine HCl (ZYRTEC ALLERGY PO) Take by mouth. PRN (Patient not taking: Reported on 05/14/2024)   hydrochlorothiazide  (HYDRODIURIL ) 25 MG tablet Take 1 tablet (25 mg total) by mouth daily with breakfast.   olmesartan  (BENICAR ) 40 MG tablet Take 1 tablet (40 mg total) by mouth daily. for blood pressure   [DISCONTINUED] Levocetirizine Dihydrochloride (XYZAL PO) Take by mouth. (Patient not taking: Reported  on 03/11/2023)   No facility-administered encounter medications on file as of 05/14/2024.    Past Medical History:  Diagnosis Date   GERD without esophagitis    Hypertension     Past Surgical History:  Procedure Laterality Date   WISDOM TOOTH EXTRACTION  2014    Family History  Problem Relation Age of Onset   Hypothyroidism Mother    Hypertension Father    Diabetes Maternal Grandmother    Heart failure Paternal Grandmother 1   Heart attack Paternal Grandfather 10   Heart attack Paternal Uncle 40       40-50 x 2    Social History   Socioeconomic History   Marital status: Married    Spouse name: Not on file   Number of children: 1   Years of education: Not on file   Highest education level: Not on file  Occupational History   Occupation: Farming  Tobacco Use   Smoking status: Never   Smokeless tobacco: Current    Types: Chew   Tobacco comments:    5/7 days chewing   Vaping Use   Vaping status: Never Used  Substance and Sexual Activity   Alcohol use: No   Drug use: No   Sexual activity: Yes    Partners: Female    Birth control/protection: None  Other Topics Concern   Not on file  Social History Narrative   Not on file   Social Drivers of Health   Financial Resource Strain: Not on file  Food Insecurity: Not on file  Transportation Needs: Not on file  Physical Activity: Sufficiently Active (01/05/2019)   Exercise Vital Sign    Days of Exercise per Week: 6 days    Minutes of Exercise per Session: 120 min  Stress: No Stress Concern Present (01/05/2019)   [employer] of Occupational Health - Occupational Stress Questionnaire    Feeling of Stress : Only a little  Social Connections: Not on file  Intimate Partner Violence: Not on file    Review of Systems  Constitutional:  Positive for weight loss. Negative for chills, fever and malaise/fatigue.       Intentional weight loss  Eyes:  Negative for blurred vision and double vision.  Respiratory:   Negative for cough and shortness of breath.   Cardiovascular:  Negative for chest pain, palpitations and leg swelling.  Gastrointestinal:  Negative for abdominal pain, constipation, diarrhea, nausea and vomiting.  Genitourinary:  Negative for dysuria, frequency and urgency.  Neurological:  Negative for dizziness, focal weakness and headaches.  Psychiatric/Behavioral:  Negative for depression and substance abuse. The patient is not nervous/anxious.         Objective    BP 116/80   Pulse 75   Temp 97.8 F (36.6 C) (Temporal)   Ht 6' (1.829 m)   Wt 238 lb (108 kg)   SpO2 95%   BMI 32.28 kg/m   Physical Exam Constitutional:      General: He is not in acute distress.    Appearance: He is not ill-appearing.  HENT:     Mouth/Throat:     Mouth: Mucous membranes are moist.     Pharynx: Oropharynx is clear.   Eyes:     Extraocular Movements: Extraocular movements intact.     Conjunctiva/sclera: Conjunctivae normal.    Cardiovascular:     Rate and Rhythm: Normal rate and regular rhythm.  Pulmonary:     Effort: Pulmonary effort is normal.     Breath sounds: Normal breath sounds.  Abdominal:     General: Bowel sounds are normal.     Palpations: Abdomen is soft.     Tenderness: There is no abdominal tenderness. There is no guarding or rebound.   Musculoskeletal:     Cervical back: Normal range of motion and neck supple.     Right lower leg: No edema.     Left lower leg: No edema.   Skin:    General: Skin is warm and dry.   Neurological:     General: No focal deficit present.     Mental Status: He is alert and oriented to person, place, and time.     Cranial Nerves: No cranial nerve deficit.     Motor: No weakness.     Coordination: Coordination normal.     Gait: Gait normal.   Psychiatric:        Mood and Affect: Mood normal.        Behavior: Behavior normal.        Thought Content: Thought content normal.     {Labs (Optional):23779}    Assessment & Plan:    Problem List Items Addressed This Visit     GERD (gastroesophageal reflux disease)   Relevant Orders   Ambulatory referral to Gastroenterology   Hyperlipidemia   Relevant Medications   hydrochlorothiazide  (HYDRODIURIL ) 25 MG tablet   olmesartan  (BENICAR ) 40 MG  tablet   Other Relevant Orders   Lipid panel   Obesity (BMI 30.0-34.9)   Relevant Orders   Hemoglobin A1c   TSH   T4, free   Basic metabolic panel with GFR   CBC with Differential/Platelet   Vitamin D  deficiency   Relevant Orders   VITAMIN D  25 Hydroxy (Vit-D Deficiency, Fractures)   Other Visit Diagnoses       Essential hypertension    -  Primary   Relevant Medications   hydrochlorothiazide  (HYDRODIURIL ) 25 MG tablet   olmesartan  (BENICAR ) 40 MG tablet   Other Relevant Orders   Basic metabolic panel with GFR   CBC with Differential/Platelet     Fatty liver       Relevant Orders   Hepatic function panel   Basic metabolic panel with GFR   Protime-INR   Ambulatory referral to Gastroenterology     Mild intermittent reactive airway disease without complication         Hepatomegaly       Relevant Orders   Ferritin   Hepatic function panel   Protime-INR   Gamma GT   Ambulatory referral to Gastroenterology     Hypercalcemia       Relevant Orders   Basic metabolic panel with GFR     Elevated LFTs       Relevant Orders   Ferritin   Hepatic function panel   Protime-INR   Gamma GT   CBC with Differential/Platelet   Ambulatory referral to Gastroenterology       Return in about 3 months (around 08/14/2024) for Fasting follow up.   Boby Mackintosh, NP-C

## 2024-05-14 NOTE — Patient Instructions (Signed)
 Thank you for trusting us  with your health care.  Please go downstairs for labs before you leave.  Let me know when you need medication refills.  Today I refilled your hydrochlorothiazide  and omeprazole .  I referred you to [facility] and they will call you to schedule a visit.  I will be in touch with your results and with recommendations.

## 2024-05-17 DIAGNOSIS — J45909 Unspecified asthma, uncomplicated: Secondary | ICD-10-CM | POA: Insufficient documentation

## 2024-05-17 DIAGNOSIS — R7989 Other specified abnormal findings of blood chemistry: Secondary | ICD-10-CM | POA: Insufficient documentation

## 2024-05-17 DIAGNOSIS — R160 Hepatomegaly, not elsewhere classified: Secondary | ICD-10-CM | POA: Insufficient documentation

## 2024-05-17 MED ORDER — OMEPRAZOLE 40 MG PO CPDR: DELAYED_RELEASE_CAPSULE | ORAL | 1 refills | Status: DC

## 2024-05-17 NOTE — Assessment & Plan Note (Signed)
 Reports 14 lb weight loss with healthy diet and exercise. Keep up the good work

## 2024-05-17 NOTE — Assessment & Plan Note (Signed)
Recheck calcium level

## 2024-05-17 NOTE — Assessment & Plan Note (Signed)
 Referral to GI

## 2024-05-17 NOTE — Assessment & Plan Note (Signed)
 Treat allergies. Use albuterol  prn. Follow up if needing albuterol  more than 2x per week

## 2024-05-17 NOTE — Assessment & Plan Note (Signed)
 Well controlled. Continue current medications, low sodium diet and exercise.

## 2024-05-17 NOTE — Assessment & Plan Note (Signed)
 Elevated on several occasions with hx of fatty liver and hepatomegaly. Referral to GI for further evaluation

## 2024-05-17 NOTE — Assessment & Plan Note (Addendum)
 Continue statin therapy, low fat diet and exercise. He is on ASA 81 mg per previous PCP.  Check lipid panel.  Follow up here fasting in 3 months.

## 2024-05-17 NOTE — Assessment & Plan Note (Signed)
 Controlled with omeprazole  but he has been taking this daily for years. Referral to GI for further evaluation.

## 2024-05-17 NOTE — Assessment & Plan Note (Signed)
 Continue vitamin D  supplement. Check vitamin D  and follow up

## 2024-06-07 ENCOUNTER — Telehealth: Payer: Self-pay

## 2024-06-07 NOTE — Telephone Encounter (Signed)
 Copied from CRM [id number]. Topic: General - Call Back - No Documentation >> Jun 04, 2024  4:48 PM Chasity T wrote: Reason for CRM: patient returning call but no notes were documented. Please return patient call

## 2024-06-29 ENCOUNTER — Encounter: Payer: Self-pay | Admitting: Physician Assistant

## 2024-07-05 ENCOUNTER — Other Ambulatory Visit (INDEPENDENT_AMBULATORY_CARE_PROVIDER_SITE_OTHER)

## 2024-07-05 ENCOUNTER — Ambulatory Visit: Admitting: Physician Assistant

## 2024-07-05 ENCOUNTER — Encounter: Payer: Self-pay | Admitting: Physician Assistant

## 2024-07-05 VITALS — BP 124/72 | HR 87 | Ht 72.0 in | Wt 248.1 lb

## 2024-07-05 DIAGNOSIS — R160 Hepatomegaly, not elsewhere classified: Secondary | ICD-10-CM | POA: Diagnosis not present

## 2024-07-05 DIAGNOSIS — R7401 Elevation of levels of liver transaminase levels: Secondary | ICD-10-CM | POA: Diagnosis not present

## 2024-07-05 DIAGNOSIS — R7989 Other specified abnormal findings of blood chemistry: Secondary | ICD-10-CM | POA: Diagnosis not present

## 2024-07-05 DIAGNOSIS — K760 Fatty (change of) liver, not elsewhere classified: Secondary | ICD-10-CM

## 2024-07-05 DIAGNOSIS — K219 Gastro-esophageal reflux disease without esophagitis: Secondary | ICD-10-CM | POA: Diagnosis not present

## 2024-07-05 LAB — CBC WITH DIFFERENTIAL/PLATELET
Basophils Absolute: 0.1 K/uL (ref 0.0–0.1)
Basophils Relative: 0.9 % (ref 0.0–3.0)
Eosinophils Absolute: 0.1 K/uL (ref 0.0–0.7)
Eosinophils Relative: 1.3 % (ref 0.0–5.0)
HCT: 41.7 % (ref 39.0–52.0)
Hemoglobin: 14.4 g/dL (ref 13.0–17.0)
Lymphocytes Relative: 27.7 % (ref 12.0–46.0)
Lymphs Abs: 1.8 K/uL (ref 0.7–4.0)
MCHC: 34.6 g/dL (ref 30.0–36.0)
MCV: 82.7 fl (ref 78.0–100.0)
Monocytes Absolute: 0.5 K/uL (ref 0.1–1.0)
Monocytes Relative: 7.5 % (ref 3.0–12.0)
Neutro Abs: 4 K/uL (ref 1.4–7.7)
Neutrophils Relative %: 62.6 % (ref 43.0–77.0)
Platelets: 234 K/uL (ref 150.0–400.0)
RBC: 5.05 Mil/uL (ref 4.22–5.81)
RDW: 13.6 % (ref 11.5–15.5)
WBC: 6.4 K/uL (ref 4.0–10.5)

## 2024-07-05 LAB — PROTIME-INR
INR: 1 ratio (ref 0.8–1.0)
Prothrombin Time: 11 s (ref 9.6–13.1)

## 2024-07-05 LAB — COMPREHENSIVE METABOLIC PANEL WITH GFR
ALT: 44 U/L (ref 0–53)
AST: 34 U/L (ref 0–37)
Albumin: 4.7 g/dL (ref 3.5–5.2)
Alkaline Phosphatase: 60 U/L (ref 39–117)
BUN: 13 mg/dL (ref 6–23)
CO2: 29 meq/L (ref 19–32)
Calcium: 9.8 mg/dL (ref 8.4–10.5)
Chloride: 101 meq/L (ref 96–112)
Creatinine, Ser: 0.84 mg/dL (ref 0.40–1.50)
GFR: 116.49 mL/min (ref >60.00)
Glucose, Bld: 99 mg/dL (ref 70–99)
Potassium: 3.8 meq/L (ref 3.5–5.1)
Sodium: 140 meq/L (ref 135–145)
Total Bilirubin: 0.9 mg/dL (ref 0.2–1.2)
Total Protein: 7.4 g/dL (ref 6.0–8.3)

## 2024-07-05 NOTE — Patient Instructions (Addendum)
 Your provider has requested that you go to the basement level for lab work before leaving today. Press B on the elevator. The lab is located at the first door on the left as you exit the elevator.  You have been scheduled for an abdominal ultrasound at [facility] (1st floor of hospital) on Wednesday 07/07/24 at 8:30 am. Please arrive 30 minutes prior to your appointment for registration. Make certain not to have anything to eat or drink 6 hours prior to your appointment. Should you need to reschedule your appointment, please contact radiology at [phone]. This test typically takes about 30 minutes to perform.  Continue Omeprazole  40 mg daily 30-60 minutes before breakfast.   _______________________________________________________  If your blood pressure at your visit was 140/90 or greater, please contact your primary care physician to follow up on this.  _______________________________________________________  If you are age 95 or older, your body mass index should be between 23-30. Your Body mass index is 33.65 kg/m. If this is out of the aforementioned range listed, please consider follow up with your Primary Care Provider.  If you are age 76 or younger, your body mass index should be between 19-25. Your Body mass index is 33.65 kg/m. If this is out of the aformentioned range listed, please consider follow up with your Primary Care Provider.   ________________________________________________________  The [facility] providers would like to encourage you to use MYCHART to communicate with providers for non-urgent requests or questions.  Due to long hold times on the telephone, sending your provider a message by [facility] may be a faster and more efficient way to get a response.  Please allow 48 business hours for a response.  Please remember that this is for non-urgent requests.  _______________________________________________________  [facility] is using a  team-based approach to care.  Your team is made up of your doctor and two to three APPS. Our APPS (Nurse Practitioners and Physician Assistants) work with your physician to ensure care continuity for you. They are fully qualified to address your health concerns and develop a treatment plan. They communicate directly with your gastroenterologist to care for you. Seeing the Advanced Practice Practitioners on your physician's team can help you by facilitating care more promptly, often allowing for earlier appointments, access to diagnostic testing, procedures, and other specialty referrals.

## 2024-07-05 NOTE — Progress Notes (Signed)
 Chief Complaint: Fatty Liver, Elevated LFT's  HPI:    [name] is a 33 year old male with a past medical history as listed below including GERD, who was referred to me by Lendia Boby CROME, NP-C for a complaint of fatty liver and elevated LFTs.      04/26/2022 abdominal ultrasound done for elevated LFTs with hepatomegaly and evidence of hepatic steatosis.    Liver enzymes have been minimally elevated since 2 years ago, initially 04/23/2022 AST 43, ALT 69, normal total bili.  Stay consistent over the next year, 9 months ago 09/10/2023 AST 58, ALT 72, 1 month ago on 05/14/2024 total bili was minimally elevated at 1.4 and AST 38, ALT normal at 43.    05/14/2024 vitamin D , T4, TSH, ferritin, hemoglobin A1c and GGT all normal.  INR minimally elevated at 1.1.  Lipid panel shows elevated triglycerides at 257, HDL low at 36.2 VLDL high at 51.4.  Platelets 225.  0 (Fib 4 score calculated 0.8)    05/14/2024 office visit with PCP at that time discussed history of hepatomegaly and fatty liver as well as persistently elevated LFTs.  Also history of reflux since 6 grade on Omeprazole  daily.    Today, the patient tells me that he has been told for a few years now that his liver enzymes are elevated.  Most recently they came back down slightly after he had lost around 20 pounds.  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  He denies any family history of liver disease.  Does have a family history of fatty liver in his mother and grandmother.  Other than chronic reflux for which he takes Omeprazole  40 mg daily he has no other GI issues or complaints.    Denies alcohol use.    Denies fever, chills change in bowel habits, fatigue or abdominal pain.  Past Medical History:  Diagnosis Date   GERD without esophagitis    Hypertension     Past Surgical History:  Procedure Laterality Date   WISDOM TOOTH EXTRACTION  2014     Current Outpatient Medications  Medication Sig Dispense Refill   albuterol  (VENTOLIN  HFA) 108 (90 Base) MCG/ACT inhaler Inhale 2 puffs into the lungs every 6 (six) hours as needed for wheezing or shortness of breath. 8 g 2   aspirin 81 MG chewable tablet Chew 81 mg daily by mouth.     Cetirizine HCl (ZYRTEC ALLERGY PO) Take by mouth. PRN (Patient not taking: Reported on 05/14/2024)     Cholecalciferol 5000 units TABS Take 5,000 Units by mouth daily.      hydrochlorothiazide  (HYDRODIURIL ) 25 MG tablet Take 1 tablet (25 mg total) by mouth daily with breakfast. 90 tablet 1   olmesartan  (BENICAR ) 40 MG tablet Take 1 tablet (40 mg total) by mouth daily. for blood pressure 90 tablet 1   omeprazole  (PRILOSEC) 40 MG capsule TAKE 1 CAPSULE BY MOUTH DAILY TO PREVENTHEARTBURN AND INDIGESTION 90 capsule 1   Phytosterol Esters (CHOLEST CARE PO) Take by mouth daily. Takes two tablets daily     rosuvastatin  (CRESTOR ) 5 MG tablet Take 1 tablet (5 mg total) by mouth daily. 90 tablet 3   No current facility-administered medications for this visit.    Allergies as of 07/05/2024   (No Known Allergies)    Family History  Problem Relation Age of Onset   Hypothyroidism  Mother    Hypertension Father    Diabetes Maternal Grandmother    Heart failure Paternal Grandmother 58   Heart attack Paternal Grandfather 87   Heart attack Paternal Uncle 40       40-50 x 2    Social History   Socioeconomic History   Marital status: Married    Spouse name: Not on file   Number of children: 1   Years of education: Not on file   Highest education level: Not on file  Occupational History   Occupation: Farming  Tobacco Use   Smoking status: Never   Smokeless tobacco: Current    Types: Chew   Tobacco comments:    5/7 days chewing   Vaping Use   Vaping status: Never Used  Substance and Sexual Activity   Alcohol use: No   Drug use: No   Sexual activity: Yes    Partners: Female    Birth control/protection:  None  Other Topics Concern   Not on file  Social History Narrative   Not on file   Social Drivers of Health   Financial Resource Strain: Not on file  Food Insecurity: Not on file  Transportation Needs: Not on file  Physical Activity: Sufficiently Active (01/05/2019)   Exercise Vital Sign    Days of Exercise per Week: 6 days    Minutes of Exercise per Session: 120 min  Stress: No Stress Concern Present (01/05/2019)   [employer] of Occupational Health - Occupational Stress Questionnaire    Feeling of Stress : Only a little  Social Connections: Not on file  Intimate Partner Violence: Not on file    Review of Systems:    Constitutional: No fever or chills Skin: No rash Cardiovascular: No chest pain  Respiratory: No SOB  Gastrointestinal: See HPI and otherwise negative Genitourinary: No dysuria  Neurological: No headache, dizziness or syncope Musculoskeletal: No new muscle or joint pain Hematologic: No bleeding  Psychiatric: No history of depression or anxiety   Physical Exam:  Vital signs: BP 124/72 (BP Location: Left Arm, Patient Position: Sitting, Cuff Size: Large)   Pulse 87   Ht 6' (1.829 m)   Wt 248 lb 2 oz (112.5 kg)   BMI 33.65 kg/m    Constitutional:   Pleasant overweight Caucasian male appears to be in NAD, Well developed, Well nourished, alert and cooperative Head:  Normocephalic and atraumatic. Eyes:   PEERL, EOMI. No icterus. Conjunctiva pink. Ears:  Normal auditory acuity. Neck:  Supple Throat: Oral cavity and pharynx without inflammation, swelling or lesion.  Respiratory: Respirations even and unlabored. Lungs clear to auscultation bilaterally.   No wheezes, crackles, or rhonchi.  Cardiovascular: Normal S1, S2. No MRG. Regular rate and rhythm. No peripheral edema, cyanosis or pallor.  Gastrointestinal:  Soft, nondistended, nontender. No rebound or guarding. Normal bowel sounds. No appreciable masses or hepatomegaly. Rectal:  Not performed.  Msk:   Symmetrical without gross deformities. Without edema, no deformity or joint abnormality.  Neurologic:  Alert and  oriented x4;  grossly normal neurologically.  Skin:   Dry and intact without significant lesions or rashes. Psychiatric: Demonstrates good judgement and reason without abnormal affect or behaviors.  RELEVANT LABS AND IMAGING: CBC    Component Value Date/Time   WBC 7.0 05/14/2024 0904   RBC 5.24 05/14/2024 0904   HGB 15.2 05/14/2024 0904   HCT 43.3 05/14/2024 0904   PLT 225.0 05/14/2024 0904   MCV 82.6 05/14/2024 0904   MCH 29.0 09/10/2023 0941  MCHC 35.0 05/14/2024 0904   RDW 13.7 05/14/2024 0904   LYMPHSABS 2.1 05/14/2024 0904   MONOABS 0.6 05/14/2024 0904   EOSABS 0.1 05/14/2024 0904   BASOSABS 0.1 05/14/2024 0904    CMP     Component Value Date/Time   NA 137 05/14/2024 0904   K 3.8 05/14/2024 0904   CL 96 05/14/2024 0904   CO2 31 05/14/2024 0904   GLUCOSE 104 (H) 05/14/2024 0904   BUN 23 05/14/2024 0904   CREATININE 1.12 05/14/2024 0904   CREATININE 0.93 09/10/2023 0941   CALCIUM  10.0 05/14/2024 0904   PROT 7.8 05/14/2024 0904   ALBUMIN 4.8 05/14/2024 0904   AST 38 (H) 05/14/2024 0904   ALT 43 05/14/2024 0904   ALKPHOS 66 05/14/2024 0904   BILITOT 1.4 (H) 05/14/2024 0904   GFRNONAA 119 01/18/2021 1037   GFRAA 138 01/18/2021 1037    Assessment: 1.  Elevated LFTs: Persistently elevated mild transaminitis over the past 2 years, initially ultrasound with fatty liver and hepatomegaly which are still likely the cause, recent decline after 20 pound weight loss, though bilirubin was minimally elevated; most likely fatty liver but will proceed with further testing to rule out other causes today including autoimmune 2.  Fatty liver and hepatomegaly 3.  GERD: Controlled on Omeprazole  40 mg daily over the past 3 to 4 years, no prior EGD; likely chronic gastritis +/- diet and lifestyle exacerbating  Plan: 1.  Ordered right upper quadrant ultrasound with  elastography and Doppler 2.  Fib 4 score today calculated 0.8, which indicates a low chance of fibrosis and pending additional labs can likely just monitor LFTs in the future. 3.  Did discuss a slow and steady weight loss for fatty liver 4.  Continue Omeprazole  40 mg daily for now.  Did discuss that I would recommend an EGD at some point given his chronic reflux symptoms.  Patient would like to wait on this. 5.  Patient to follow in clinic per recommendations after labs and imaging above.  Assigned to Dr. Nandigam today.  Delon Failing, PA-C [facility] 07/05/2024, 1:15 PM  Cc: Henson, Vickie L, NP-C

## 2024-07-07 ENCOUNTER — Ambulatory Visit (HOSPITAL_COMMUNITY)
Admission: RE | Admit: 2024-07-07 | Discharge: 2024-07-07 | Discharge status: Home / Self Care | Source: Ambulatory Visit | Attending: Physician Assistant

## 2024-07-07 ENCOUNTER — Ambulatory Visit (HOSPITAL_COMMUNITY)
Admission: RE | Admit: 2024-07-07 | Discharge: 2024-07-07 | Disposition: A | Discharge status: Home / Self Care | Source: Ambulatory Visit | Attending: Physician Assistant | Admitting: Physician Assistant

## 2024-07-07 DIAGNOSIS — K760 Fatty (change of) liver, not elsewhere classified: Secondary | ICD-10-CM | POA: Insufficient documentation

## 2024-07-07 DIAGNOSIS — R160 Hepatomegaly, not elsewhere classified: Secondary | ICD-10-CM

## 2024-07-07 DIAGNOSIS — R7989 Other specified abnormal findings of blood chemistry: Secondary | ICD-10-CM | POA: Diagnosis present

## 2024-07-13 ENCOUNTER — Ambulatory Visit: Payer: Self-pay | Admitting: Physician Assistant

## 2024-07-14 LAB — HEPATITIS B CORE ANTIBODY, TOTAL: Hep B Core Total Ab: NONREACTIVE

## 2024-07-14 LAB — CERULOPLASMIN: Ceruloplasmin: 21 mg/dL (ref 14–30)

## 2024-07-14 LAB — HEPATITIS A ANTIBODY, TOTAL: Hepatitis A AB,Total: REACTIVE — AB

## 2024-07-14 LAB — HEPATITIS B SURFACE ANTIGEN: Hepatitis B Surface Ag: NONREACTIVE

## 2024-07-14 LAB — ANTI-SMOOTH MUSCLE ANTIBODY, IGG: Actin (Smooth Muscle) Antibody (IGG): <20 U (ref <20)

## 2024-07-14 LAB — HEPATITIS B SURFACE ANTIBODY,QUALITATIVE: Hep B S Ab: NONREACTIVE

## 2024-07-14 LAB — ALPHA-1-ANTITRYPSIN: A-1 Antitrypsin, Ser: 107 mg/dL (ref 83–199)

## 2024-07-14 LAB — ANA: Anti Nuclear Antibody (ANA): NEGATIVE

## 2024-07-14 LAB — MITOCHONDRIAL ANTIBODIES: Mitochondrial M2 Ab, IgG: <=20 U (ref <=20.0)

## 2024-07-14 LAB — IGA: Immunoglobulin A: 186 mg/dL (ref 47–310)

## 2024-07-14 LAB — HEPATITIS C ANTIBODY: Hepatitis C Ab: NONREACTIVE

## 2024-07-14 LAB — IGG: IgG (Immunoglobin G), Serum: 1245 mg/dL (ref 600–1640)

## 2024-07-22 ENCOUNTER — Other Ambulatory Visit: Payer: Self-pay | Admitting: Family Medicine

## 2024-07-22 ENCOUNTER — Other Ambulatory Visit: Payer: Self-pay | Admitting: Nurse Practitioner

## 2024-07-22 DIAGNOSIS — E782 Mixed hyperlipidemia: Secondary | ICD-10-CM

## 2024-10-26 ENCOUNTER — Other Ambulatory Visit: Payer: Self-pay | Admitting: Family Medicine

## 2024-10-26 DIAGNOSIS — E782 Mixed hyperlipidemia: Secondary | ICD-10-CM

## 2024-11-09 ENCOUNTER — Other Ambulatory Visit: Payer: Self-pay | Admitting: Family Medicine

## 2024-11-09 DIAGNOSIS — K219 Gastro-esophageal reflux disease without esophagitis: Secondary | ICD-10-CM

## 2024-11-24 ENCOUNTER — Ambulatory Visit: Payer: Self-pay | Admitting: Family Medicine

## 2024-11-24 ENCOUNTER — Ambulatory Visit: Admitting: Family Medicine

## 2024-11-24 ENCOUNTER — Encounter: Payer: Self-pay | Admitting: Family Medicine

## 2024-11-24 VITALS — BP 130/84 | HR 74 | Temp 98.4°F | Ht 73.0 in | Wt 250.0 lb

## 2024-11-24 DIAGNOSIS — K760 Fatty (change of) liver, not elsewhere classified: Secondary | ICD-10-CM

## 2024-11-24 DIAGNOSIS — E782 Mixed hyperlipidemia: Secondary | ICD-10-CM

## 2024-11-24 DIAGNOSIS — E559 Vitamin D deficiency, unspecified: Secondary | ICD-10-CM

## 2024-11-24 DIAGNOSIS — K219 Gastro-esophageal reflux disease without esophagitis: Secondary | ICD-10-CM | POA: Diagnosis not present

## 2024-11-24 DIAGNOSIS — I10 Essential (primary) hypertension: Secondary | ICD-10-CM

## 2024-11-24 LAB — BASIC METABOLIC PANEL WITH GFR
BUN: 11 mg/dL (ref 6–23)
CO2: 30 meq/L (ref 19–32)
Calcium: 9.8 mg/dL (ref 8.4–10.5)
Chloride: 99 meq/L (ref 96–112)
Creatinine, Ser: 0.88 mg/dL (ref 0.40–1.50)
GFR: 114.56 mL/min
Glucose, Bld: 102 mg/dL — ABNORMAL HIGH (ref 70–99)
Potassium: 3.9 meq/L (ref 3.5–5.1)
Sodium: 137 meq/L (ref 135–145)

## 2024-11-24 LAB — CBC
HCT: 44.9 % (ref 39.0–52.0)
Hemoglobin: 15.2 g/dL (ref 13.0–17.0)
MCHC: 33.9 g/dL (ref 30.0–36.0)
MCV: 83 fl (ref 78.0–100.0)
Platelets: 217 K/uL (ref 150.0–400.0)
RBC: 5.41 Mil/uL (ref 4.22–5.81)
RDW: 13.5 % (ref 11.5–15.5)
WBC: 6.9 K/uL (ref 4.0–10.5)

## 2024-11-24 LAB — LIPID PANEL
Cholesterol: 153 mg/dL (ref 28–200)
HDL: 42.8 mg/dL
LDL Cholesterol: 67 mg/dL (ref 10–99)
NonHDL: 110.6
Total CHOL/HDL Ratio: 4
Triglycerides: 217 mg/dL — ABNORMAL HIGH (ref 10.0–149.0)
VLDL: 43.4 mg/dL — ABNORMAL HIGH (ref 0.0–40.0)

## 2024-11-24 LAB — VITAMIN D 25 HYDROXY (VIT D DEFICIENCY, FRACTURES): VITD: 65.08 ng/mL (ref 30.00–100.00)

## 2024-11-24 NOTE — Patient Instructions (Signed)
 Please go downstairs for labs before you leave and I will be in touch with your results.  You can cut back on your vitamin D  supplement.  Most likely no more than 1000 IUs of vitamin D3 daily

## 2024-11-24 NOTE — Progress Notes (Signed)
 "  Subjective:     Patient ID: Jaime Little, male    DOB: [birth year], 34 y.o.   MRN: [id number]  Chief Complaint  Patient presents with   Follow-up    HPI  Discussed the use of AI scribe software for clinical note transcription with the patient, who gave verbal consent to proceed.  History of Present Illness Jaime Little is a 34 year old male with chronic acid reflux and hyperlipidemia who presents for follow-up on chronic health conditions.  Gastroesophageal reflux symptoms - Chronic acid reflux for approximately ten years - Symptoms controlled with omeprazole  -Reviewed notes from his GI visit as well as results.  He declined EGD for now.   Hyperlipidemia and cardiovascular risk - Monitored for hyperlipidemia with elevated triglycerides - Recent dietary indiscretion during vacation may have increased triglyceride levels - Takes over-the-counter omega-3 fatty acids and Crestor  5 mg daily - Family history of heart disease  Vitamin d  abnormalities - Previously diagnosed with low vitamin D  - Prescribed 5000 IU daily - Recent laboratory evaluation showed elevated vitamin D  levels  Hypertension - Takes olmesartan  and hydrochlorothiazide  for blood pressure control - Blood pressure slightly elevated at today's visit - Attributes elevated blood pressure to stress from traffic prior to clinic visit    Health Maintenance Due  Topic Date Due   COVID-19 Vaccine (1) Never done   DTaP/Tdap/Td (1 - Tdap) Never done   Pneumococcal Vaccine (1 of 2 - PCV) Never done   Hepatitis B Vaccines 19-59 Average Risk (1 of 3 - 19+ 3-dose series) Never done   HPV VACCINES (1 - 3-dose SCDM series) Never done   Influenza Vaccine  Never done    Past Medical History:  Diagnosis Date   GERD without esophagitis    Hypertension     Past Surgical History:  Procedure Laterality Date   WISDOM TOOTH EXTRACTION  2014    Family History  Problem Relation Age of Onset   Hypothyroidism Mother     Hypertension Father    Diabetes Maternal Grandmother    Heart failure Paternal Grandmother 24   Heart attack Paternal Grandfather 2   Heart attack Paternal Uncle 40       40-50 x 2    Social History   Socioeconomic History   Marital status: Married    Spouse name: Not on file   Number of children: 1   Years of education: Not on file   Highest education level: Not on file  Occupational History   Occupation: Farming  Tobacco Use   Smoking status: Never   Smokeless tobacco: Current    Types: Chew   Tobacco comments:    5/7 days chewing   Vaping Use   Vaping status: Never Used  Substance and Sexual Activity   Alcohol use: No   Drug use: No   Sexual activity: Yes    Partners: Female    Birth control/protection: None  Other Topics Concern   Not on file  Social History Narrative   Not on file   Social Drivers of Health   Tobacco Use: High Risk (11/24/2024)   Patient History    Smoking Tobacco Use: Never    Smokeless Tobacco Use: Current    Passive Exposure: Not on file  Financial Resource Strain: Not on file  Food Insecurity: Not on file  Transportation Needs: Not on file  Physical Activity: Not on file  Stress: Not on file  Social Connections: Not on file  Intimate Partner Violence:  Not on file  Depression (PHQ2-9): Low Risk (05/14/2024)   Depression (PHQ2-9)    PHQ-2 Score: 0  Alcohol Screen: Not on file  Housing: Not on file  Utilities: Not on file  Health Literacy: Not on file    Outpatient Medications Prior to Visit  Medication Sig Dispense Refill   albuterol  (VENTOLIN  HFA) 108 (90 Base) MCG/ACT inhaler Inhale 2 puffs into the lungs every 6 (six) hours as needed for wheezing or shortness of breath. 8 g 2   aspirin 81 MG chewable tablet Chew 81 mg daily by mouth.     Cetirizine HCl (ZYRTEC ALLERGY PO) Take by mouth. PRN     Cholecalciferol 5000 units TABS Take 5,000 Units by mouth daily.      hydrochlorothiazide  (HYDRODIURIL ) 25 MG tablet TAKE 1 TABLET  BY MOUTH DAILY WITH BREAKFAST 90 tablet 0   olmesartan  (BENICAR ) 40 MG tablet Take 1 tablet (40 mg total) by mouth daily. for blood pressure 90 tablet 1   omeprazole  (PRILOSEC) 40 MG capsule TAKE 1 CAPSULE BY MOUTH DAILY TO PREVENTHEARTBURN AND INDIGESTION 90 capsule 0   Phytosterol Esters (CHOLEST CARE PO) Take by mouth daily. Takes two tablets daily     rosuvastatin  (CRESTOR ) 5 MG tablet Take 1 tablet (5 mg total) by mouth daily. Needs follow up appointment for refills. 30 tablet 0   No facility-administered medications prior to visit.    Allergies[1]  Review of Systems  Constitutional:  Negative for chills, fever, malaise/fatigue and weight loss.  Respiratory:  Negative for shortness of breath.   Cardiovascular:  Negative for chest pain, palpitations and leg swelling.  Gastrointestinal:  Negative for abdominal pain, constipation, diarrhea, nausea and vomiting.  Genitourinary:  Negative for dysuria, frequency and urgency.  Neurological:  Negative for dizziness and headaches.  Psychiatric/Behavioral:  Negative for depression. The patient is not nervous/anxious.        Objective:    Physical Exam Constitutional:      General: He is not in acute distress.    Appearance: He is not ill-appearing.  Eyes:     Extraocular Movements: Extraocular movements intact.     Conjunctiva/sclera: Conjunctivae normal.  Cardiovascular:     Rate and Rhythm: Normal rate.  Pulmonary:     Effort: Pulmonary effort is normal.  Musculoskeletal:     Cervical back: Normal range of motion and neck supple.  Skin:    General: Skin is warm and dry.  Neurological:     General: No focal deficit present.     Mental Status: He is alert and oriented to person, place, and time.  Psychiatric:        Mood and Affect: Mood normal.        Behavior: Behavior normal.        Thought Content: Thought content normal.      BP 130/84 (BP Location: Right Arm, Patient Position: Sitting, Cuff Size: Normal)   Pulse 74    Temp 98.4 F (36.9 C) (Oral)   Ht 6' 1 (1.854 m)   Wt 250 lb (113.4 kg)   SpO2 99%   BMI 32.98 kg/m  Wt Readings from Last 3 Encounters:  11/24/24 250 lb (113.4 kg)  07/05/24 248 lb 2 oz (112.5 kg)  05/14/24 238 lb (108 kg)       Assessment & Plan:   Problem List Items Addressed This Visit     Essential hypertension   Relevant Orders   Basic metabolic panel with GFR   CBC  Fatty liver   GERD (gastroesophageal reflux disease)   Hyperlipidemia - Primary   Relevant Orders   Lipid panel   Vitamin D  deficiency   Relevant Orders   VITAMIN D  25 Hydroxy (Vit-D Deficiency, Fractures)    Assessment and Plan Assessment & Plan Mixed hyperlipidemia Elevated triglycerides, likely exacerbated by recent dietary habits post-vacation. LDL levels are well-controlled with current rosuvastatin  therapy. - Checked triglyceride levels today - If triglycerides remain elevated, will prescribe higher dose of omega-3 fatty acids  Essential hypertension Blood pressure is well-controlled with current medication regimen, though slightly elevated today, likely due to stress from traffic. - Continue current antihypertensive regimen with olmesartan  and hydrochlorothiazide  - Low-sodium diet - Monitor blood pressure at home  Fatty liver Liver function tests have improved, and imaging results are satisfactory per GI - Work on weight loss  Gastroesophageal reflux disease Chronic GERD managed with omeprazole . - Continue current omeprazole  regimen  Vitamin D  deficiency Vitamin D  levels are elevated at 79, likely due to high-dose supplementation. Risk of vitamin D  toxicity discussed. - Discontinue cholecalciferol 5000 units daily - Hold vitamin D  supplementation for one month - Resume with 1000 IU daily after one month     I am having Donnice GAILS. Ciampa maintain his Cholecalciferol, aspirin, Phytosterol Esters (CHOLEST CARE PO), albuterol , Cetirizine HCl (ZYRTEC ALLERGY PO), olmesartan ,  rosuvastatin , omeprazole , and hydrochlorothiazide .  No orders of the defined types were placed in this encounter.      [1] No Known Allergies  "

## 2024-11-25 ENCOUNTER — Other Ambulatory Visit: Payer: Self-pay | Admitting: Family Medicine

## 2024-11-25 DIAGNOSIS — I10 Essential (primary) hypertension: Secondary | ICD-10-CM

## 2024-11-25 DIAGNOSIS — E782 Mixed hyperlipidemia: Secondary | ICD-10-CM

## 2025-03-03 ENCOUNTER — Encounter: Admitting: Family Medicine
